# Patient Record
Sex: Female | Born: 1985 | Race: Black or African American | Hispanic: No | Marital: Married | Smoking: Never smoker
Health system: Southern US, Community
[De-identification: ages and names within clinical notes are randomized; demographics above are authoritative.]

## PROBLEM LIST (undated history)

## (undated) DIAGNOSIS — I10 Essential (primary) hypertension: Secondary | ICD-10-CM

## (undated) DIAGNOSIS — E282 Polycystic ovarian syndrome: Secondary | ICD-10-CM

## (undated) DIAGNOSIS — O139 Gestational [pregnancy-induced] hypertension without significant proteinuria, unspecified trimester: Secondary | ICD-10-CM

## (undated) DIAGNOSIS — D219 Benign neoplasm of connective and other soft tissue, unspecified: Secondary | ICD-10-CM

## (undated) DIAGNOSIS — R87629 Unspecified abnormal cytological findings in specimens from vagina: Secondary | ICD-10-CM

## (undated) DIAGNOSIS — M1990 Unspecified osteoarthritis, unspecified site: Secondary | ICD-10-CM

## (undated) DIAGNOSIS — R0600 Dyspnea, unspecified: Secondary | ICD-10-CM

## (undated) DIAGNOSIS — R011 Cardiac murmur, unspecified: Secondary | ICD-10-CM

## (undated) DIAGNOSIS — O24419 Gestational diabetes mellitus in pregnancy, unspecified control: Secondary | ICD-10-CM

## (undated) DIAGNOSIS — F419 Anxiety disorder, unspecified: Secondary | ICD-10-CM

## (undated) DIAGNOSIS — E119 Type 2 diabetes mellitus without complications: Secondary | ICD-10-CM

## (undated) DIAGNOSIS — F32A Depression, unspecified: Secondary | ICD-10-CM

## (undated) DIAGNOSIS — R9431 Abnormal electrocardiogram [ECG] [EKG]: Secondary | ICD-10-CM

## (undated) HISTORY — DX: Essential (primary) hypertension: I10

## (undated) HISTORY — PX: WISDOM TOOTH EXTRACTION: SHX21

## (undated) HISTORY — PX: OVARIAN CYST DRAINAGE: SHX325

## (undated) HISTORY — DX: Gestational diabetes mellitus in pregnancy, unspecified control: O24.419

## (undated) HISTORY — DX: Benign neoplasm of connective and other soft tissue, unspecified: D21.9

## (undated) HISTORY — DX: Dyspnea, unspecified: R06.00

## (undated) HISTORY — DX: Type 2 diabetes mellitus without complications: E11.9

## (undated) HISTORY — DX: Gestational (pregnancy-induced) hypertension without significant proteinuria, unspecified trimester: O13.9

## (undated) HISTORY — DX: Unspecified abnormal cytological findings in specimens from vagina: R87.629

## (undated) HISTORY — DX: Depression, unspecified: F32.A

## (undated) HISTORY — PX: CERVICAL BIOPSY  W/ LOOP ELECTRODE EXCISION: SUR135

## (undated) HISTORY — DX: Unspecified osteoarthritis, unspecified site: M19.90

---

## 2009-08-10 ENCOUNTER — Emergency Department (HOSPITAL_BASED_OUTPATIENT_CLINIC_OR_DEPARTMENT_OTHER): Admission: EM | Admit: 2009-08-10 | Discharge: 2009-08-10 | Payer: Self-pay | Admitting: Emergency Medicine

## 2012-10-30 ENCOUNTER — Encounter (HOSPITAL_BASED_OUTPATIENT_CLINIC_OR_DEPARTMENT_OTHER): Payer: Self-pay | Admitting: *Deleted

## 2012-10-30 ENCOUNTER — Emergency Department (HOSPITAL_BASED_OUTPATIENT_CLINIC_OR_DEPARTMENT_OTHER)
Admission: EM | Admit: 2012-10-30 | Discharge: 2012-10-30 | Disposition: A | Discharge status: Home / Self Care | Payer: 59 | Attending: Emergency Medicine | Admitting: Emergency Medicine

## 2012-10-30 ENCOUNTER — Emergency Department (HOSPITAL_BASED_OUTPATIENT_CLINIC_OR_DEPARTMENT_OTHER): Payer: 59

## 2012-10-30 DIAGNOSIS — J209 Acute bronchitis, unspecified: Secondary | ICD-10-CM | POA: Insufficient documentation

## 2012-10-30 DIAGNOSIS — R0602 Shortness of breath: Secondary | ICD-10-CM | POA: Insufficient documentation

## 2012-10-30 DIAGNOSIS — J40 Bronchitis, not specified as acute or chronic: Secondary | ICD-10-CM

## 2012-10-30 DIAGNOSIS — Z3202 Encounter for pregnancy test, result negative: Secondary | ICD-10-CM | POA: Insufficient documentation

## 2012-10-30 DIAGNOSIS — E282 Polycystic ovarian syndrome: Secondary | ICD-10-CM | POA: Insufficient documentation

## 2012-10-30 DIAGNOSIS — R509 Fever, unspecified: Secondary | ICD-10-CM | POA: Insufficient documentation

## 2012-10-30 DIAGNOSIS — Z79899 Other long term (current) drug therapy: Secondary | ICD-10-CM | POA: Insufficient documentation

## 2012-10-30 HISTORY — DX: Polycystic ovarian syndrome: E28.2

## 2012-10-30 LAB — PREGNANCY, URINE: Preg Test, Ur: NEGATIVE

## 2012-10-30 IMAGING — CR DG CHEST 2V
2 series · 2 of 2 positions shown · non-contrast
Comparison: None

CLINICAL DATA: Productive cough.

CHEST - 2 VIEW

[w chest pa]
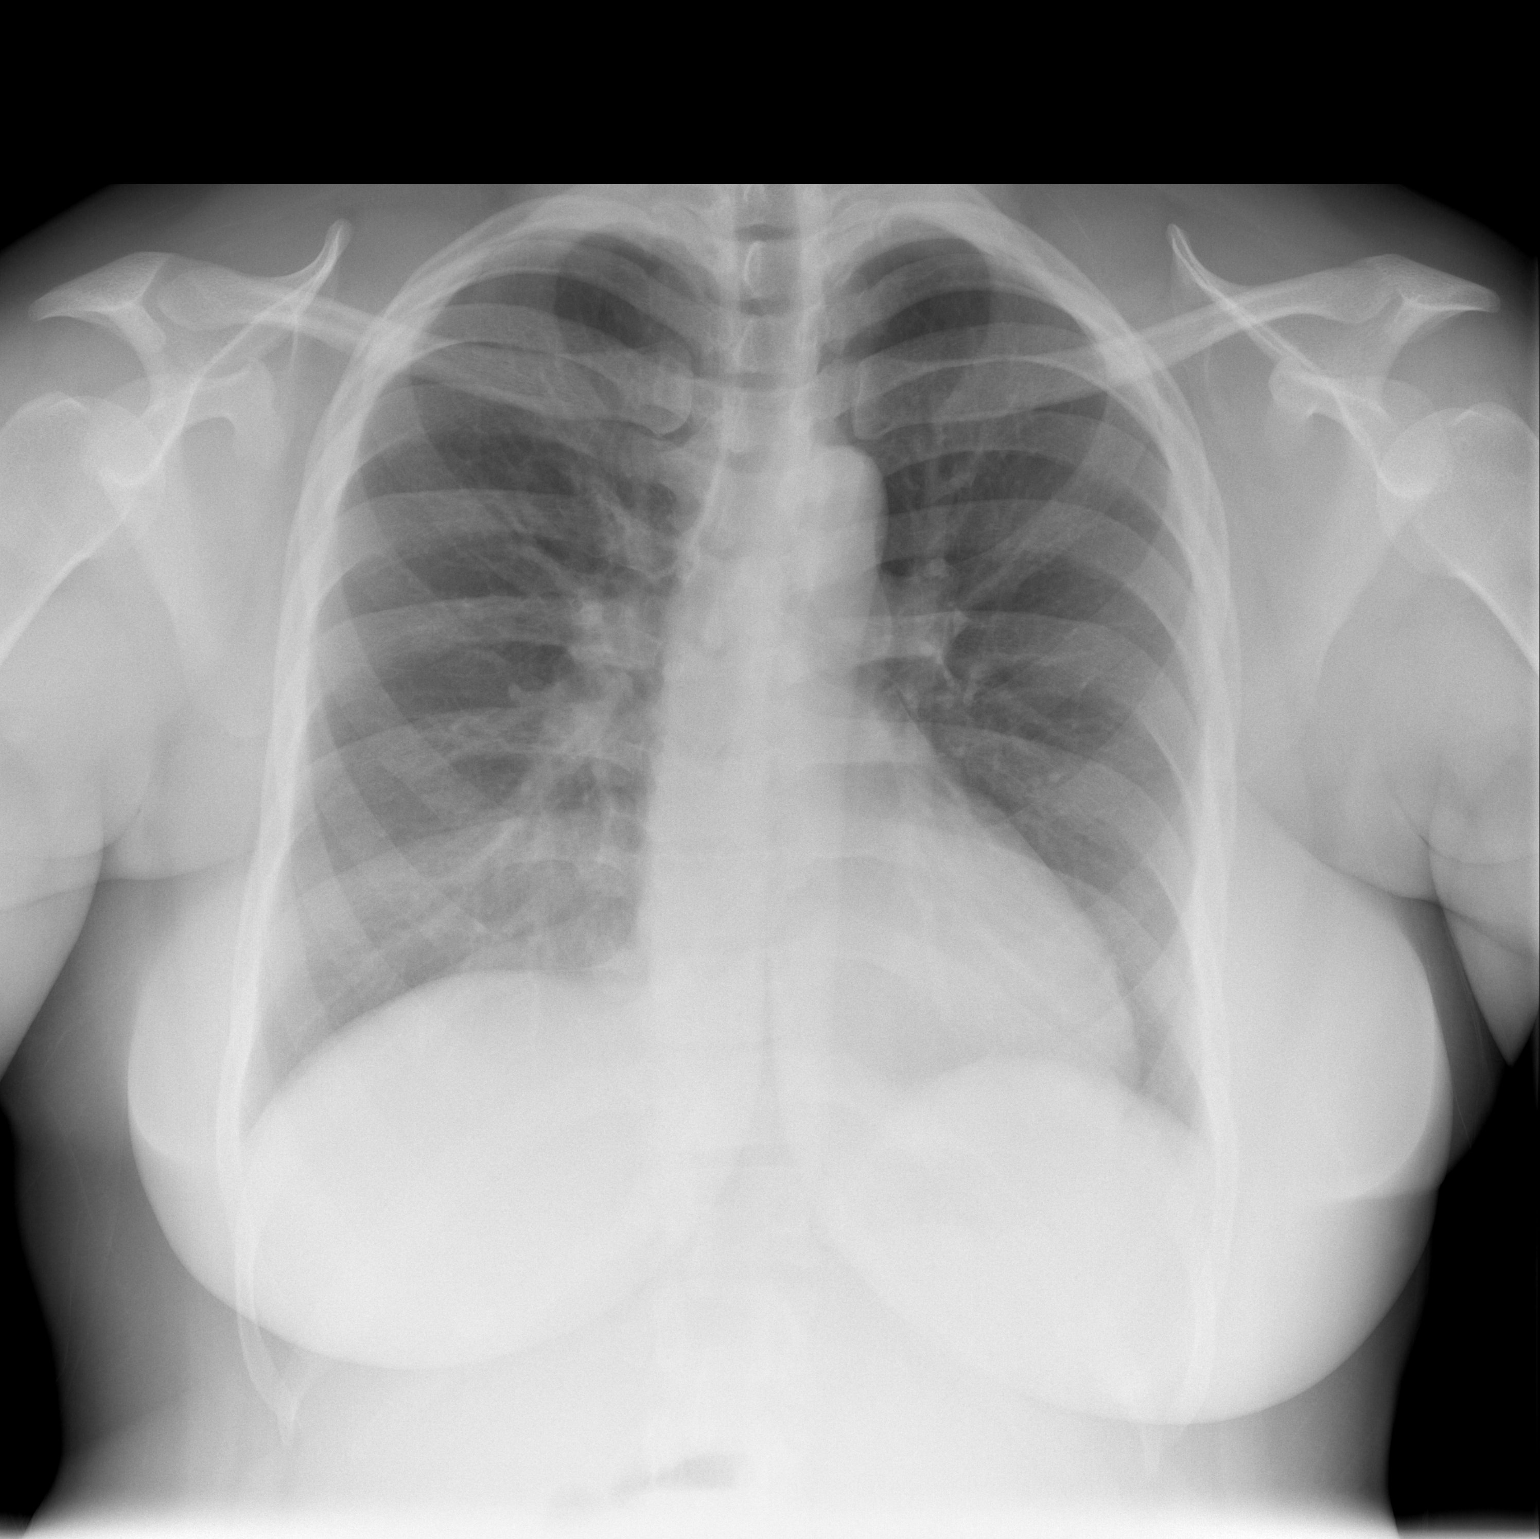

[w chest lat]
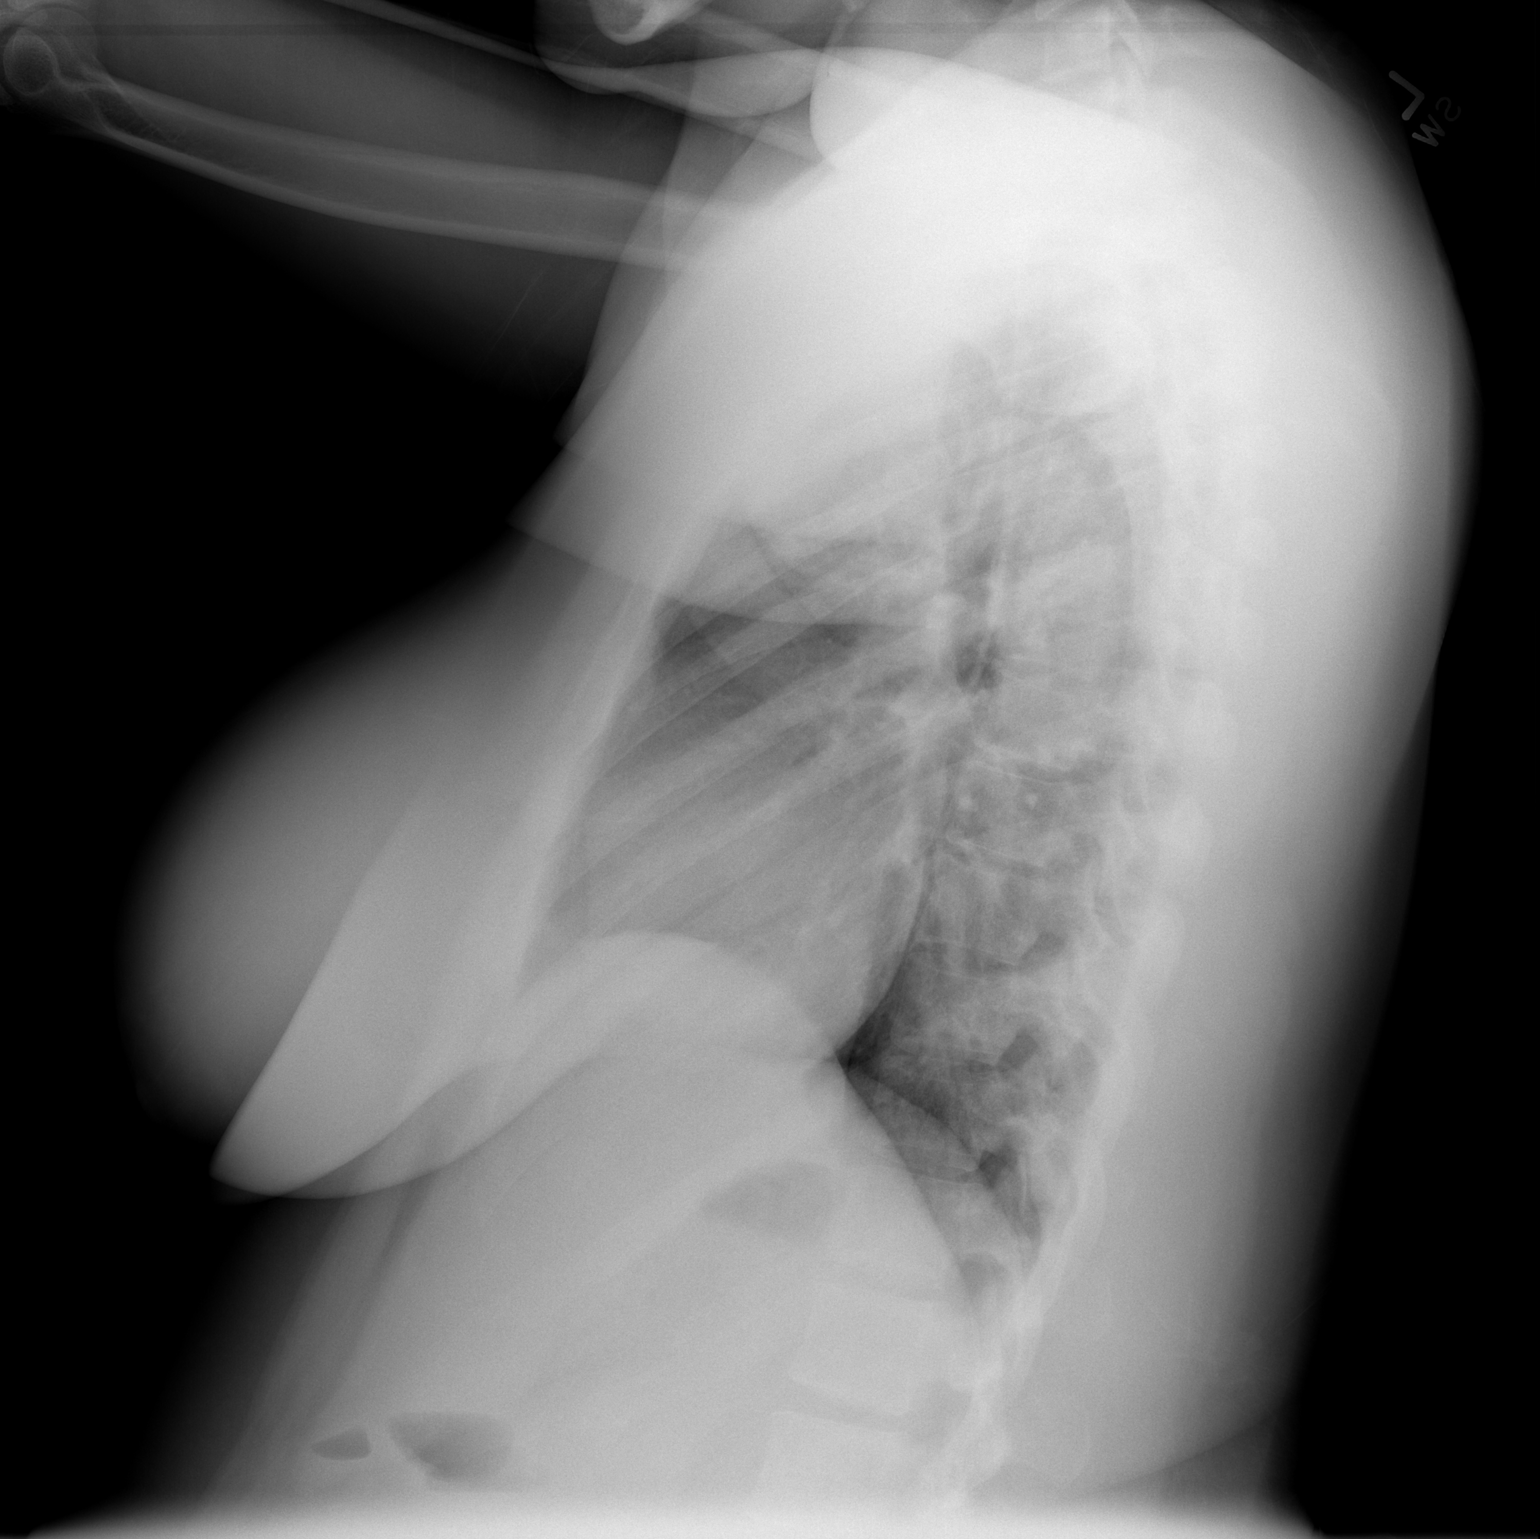

[2 of 2 positions shown; findings below may reference images not displayed]

FINDINGS: Heart and mediastinal contours are within normal limits.
No focal opacities or effusions.  No acute bony abnormality.
IMPRESSION: No active cardiopulmonary disease.

## 2012-10-30 MED ORDER — ALBUTEROL SULFATE HFA 108 (90 BASE) MCG/ACT IN AERS: 2.0000 | INHALATION_SPRAY | Freq: Four times a day (QID) | RESPIRATORY_TRACT | Status: DC | PRN

## 2012-10-30 MED ORDER — AZITHROMYCIN 250 MG PO TABS: ORAL_TABLET | ORAL | Status: DC

## 2012-10-30 NOTE — ED Notes (Signed)
Cough with thick yellowish green sputum. Started 4 days ago.

## 2012-10-30 NOTE — ED Provider Notes (Signed)
History  This chart was scribed for Emma Octave, MD by Shari Heritage, ED Scribe. The patient was seen in room MH04/MH04. Patient's care was started at 2229.   CSN: [id number]  Arrival date & time 10/30/12  2218   First MD Initiated Contact with Patient 10/30/12 2229      Chief Complaint  Patient presents with  . Cough    The history is provided by the patient. No language interpreter was used.    HPI Comments: Emma Brown is a 27 y.o. female who presents to the Emergency Department complaining of moderate, productive cough that started 4 days ago. Cough is productive of thick green sputum. She states that she has increased throat pain with cough. There is associated subjective fever and mild shortness of breath. Patient denies chest pain, abdominal pain, vomiting, nausea, diarrhea or any other symptoms. She reports multiple sick contacts at church with similar symptoms and diagnoses of bronchitis and pneumonia.  Patient does not smoke. She does not use any hormonal birth control. Patient is currently taking metformin to treat polycystic ovarian disease.    Past Medical History  Diagnosis Date  . Polycystic ovarian disease     History reviewed. No pertinent past surgical history.  No family history on file.  History  Substance Use Topics  . Smoking status: Never Smoker   . Smokeless tobacco: Not on file  . Alcohol Use: No    OB History   Grav Para Term Preterm Abortions TAB SAB Ect Mult Living                  Review of Systems A complete 10 system review of systems was obtained and all systems are negative except as noted in the HPI and PMH.   Allergies  Review of patient's allergies indicates no known allergies.  Home Medications   Current Outpatient Rx  Name  Route  Sig  Dispense  Refill  . METFORMIN HCL PO   Oral   Take by mouth.         Marland Kitchen albuterol (PROVENTIL HFA;VENTOLIN HFA) 108 (90 BASE) MCG/ACT inhaler   Inhalation   Inhale 2 puffs into  the lungs every 6 (six) hours as needed for wheezing.   1 Inhaler   2   . azithromycin (ZITHROMAX Z-PAK) 250 MG tablet      2 tablets PO day 1, then 1 tablet PO daily   6 tablet   0     Triage Vitals: BP 142/82  Pulse 92  Temp(Src) 99.8 F (37.7 C) (Oral)  Resp 16  Wt 213 lb (96.616 kg)  SpO2 98%  Physical Exam  Constitutional: She is oriented to person, place, and time. She appears well-developed and well-nourished.  HENT:  Head: Normocephalic and atraumatic.  Mouth/Throat: Mucous membranes are normal. Posterior oropharyngeal erythema present.  Mild posterior oropharyngeal erythema.  Eyes: Conjunctivae and EOM are normal. Pupils are equal, round, and reactive to light.  Neck: Normal range of motion. Neck supple.  Cardiovascular: Normal rate, regular rhythm and normal heart sounds.   No murmur heard. Pulmonary/Chest: Effort normal and breath sounds normal.  Abdominal: Soft. There is no tenderness.  Musculoskeletal: Normal range of motion.  Neurological: She is alert and oriented to person, place, and time.  Skin: Skin is warm and dry.    ED Course  Procedures (including critical care time) DIAGNOSTIC STUDIES: Oxygen Saturation is 98% on room air, normal by my interpretation.    COORDINATION OF CARE: 10:39 PM- Patient  informed of current plan for treatment and evaluation and agrees with plan at this time.   Results for orders placed during the hospital encounter of 10/30/12  PREGNANCY, URINE      Result Value Range   Preg Test, Ur NEGATIVE  NEGATIVE    Dg Chest 2 View  10/30/2012  *RADIOLOGY REPORT*  Clinical Data: Productive cough.  CHEST - 2 VIEW  Comparison: None  Findings: Heart and mediastinal contours are within normal limits. No focal opacities or effusions.  No acute bony abnormality.  IMPRESSION: No active cardiopulmonary disease.   Original Report Authenticated By: Charlett Nose, M.D.      1. Bronchitis       MDM  4 days of cough productive of  green sputum. No chest pain or shortness of breath. No fever.  Lungs clear. PE RC negative.  Will treat for bronchitis.     I personally performed the services described in this documentation, which was scribed in my presence. The recorded information has been reviewed and is accurate.    Emma Octave, MD 10/30/12 2351

## 2013-03-22 ENCOUNTER — Encounter (HOSPITAL_BASED_OUTPATIENT_CLINIC_OR_DEPARTMENT_OTHER): Payer: Self-pay | Admitting: *Deleted

## 2013-03-22 ENCOUNTER — Emergency Department (HOSPITAL_BASED_OUTPATIENT_CLINIC_OR_DEPARTMENT_OTHER)
Admission: EM | Admit: 2013-03-22 | Discharge: 2013-03-23 | Disposition: A | Discharge status: Home / Self Care | Payer: 59 | Attending: Emergency Medicine | Admitting: Emergency Medicine

## 2013-03-22 DIAGNOSIS — R059 Cough, unspecified: Secondary | ICD-10-CM | POA: Insufficient documentation

## 2013-03-22 DIAGNOSIS — R05 Cough: Secondary | ICD-10-CM | POA: Insufficient documentation

## 2013-03-22 DIAGNOSIS — Z3490 Encounter for supervision of normal pregnancy, unspecified, unspecified trimester: Secondary | ICD-10-CM

## 2013-03-22 DIAGNOSIS — K5900 Constipation, unspecified: Secondary | ICD-10-CM | POA: Insufficient documentation

## 2013-03-22 DIAGNOSIS — O9989 Other specified diseases and conditions complicating pregnancy, childbirth and the puerperium: Secondary | ICD-10-CM | POA: Insufficient documentation

## 2013-03-22 DIAGNOSIS — R509 Fever, unspecified: Secondary | ICD-10-CM | POA: Insufficient documentation

## 2013-03-22 DIAGNOSIS — Z8742 Personal history of other diseases of the female genital tract: Secondary | ICD-10-CM | POA: Insufficient documentation

## 2013-03-22 DIAGNOSIS — R300 Dysuria: Secondary | ICD-10-CM | POA: Insufficient documentation

## 2013-03-22 DIAGNOSIS — R350 Frequency of micturition: Secondary | ICD-10-CM | POA: Insufficient documentation

## 2013-03-22 DIAGNOSIS — R109 Unspecified abdominal pain: Secondary | ICD-10-CM | POA: Insufficient documentation

## 2013-03-22 DIAGNOSIS — Z79899 Other long term (current) drug therapy: Secondary | ICD-10-CM | POA: Insufficient documentation

## 2013-03-22 DIAGNOSIS — E860 Dehydration: Secondary | ICD-10-CM | POA: Insufficient documentation

## 2013-03-22 LAB — URINALYSIS, ROUTINE W REFLEX MICROSCOPIC
Nitrite: NEGATIVE
Specific Gravity, Urine: 1.03 (ref 1.005–1.030)
Urobilinogen, UA: 0.2 mg/dL (ref 0.0–1.0)
pH: 5.5 (ref 5.0–8.0)

## 2013-03-22 LAB — PREGNANCY, URINE: Preg Test, Ur: POSITIVE — AB

## 2013-03-22 NOTE — ED Notes (Signed)
Pt c/o painful urination and freq x 1 week

## 2013-03-23 NOTE — ED Notes (Signed)
MD at bedside. 

## 2013-03-23 NOTE — ED Provider Notes (Signed)
CSN: [id number]     Arrival date & time 03/22/13  2331 History  This chart was scribed for Loren Racer, MD by Dorothey Baseman, ED Scribe. This patient was seen in room MH10/MH10 and the patient's care was started at 12:31 AM.    Chief Complaint  Patient presents with  . Dysuria   Patient is a 27 y.o. female presenting with dysuria. The history is provided by the patient. No language interpreter was used.  Dysuria Pain quality:  Sharp Pain severity:  Moderate Onset quality:  Sudden Duration: 1 week. Timing:  Intermittent (after urination) Progression:  Unchanged Chronicity:  New Urinary symptoms: frequent urination   Associated symptoms: abdominal pain and fever ( subjective)   Risk factors: pregnant now    HPI Comments: Emma Brown is a 27 y.o. female who presents to the Emergency Department complaining of dysuria with a sharp pain to the suprapubic region and associated urinary frequency and irregular BMs onset 1 week ago. She reports cough with yellowish sputum and a mild subjective fever. She denies vaginal bleeding, chills. Patient is currently [redacted] weeks pregnant.   Past Medical History  Diagnosis Date  . Polycystic ovarian disease    History reviewed. No pertinent past surgical history. History reviewed. No pertinent family history. History  Substance Use Topics  . Smoking status: Never Smoker   . Smokeless tobacco: Not on file  . Alcohol Use: No   OB History   Grav Para Term Preterm Abortions TAB SAB Ect Mult Living                 Review of Systems  Constitutional: Positive for fever ( subjective). Negative for chills.  Respiratory: Positive for cough.   Gastrointestinal: Positive for abdominal pain.  Genitourinary: Positive for dysuria and frequency. Negative for vaginal bleeding.  All other systems reviewed and are negative.    Allergies  Review of patient's allergies indicates no known allergies.  Home Medications   Current Outpatient Rx  Name   Route  Sig  Dispense  Refill  . progesterone (PROMETRIUM) 200 MG capsule   Oral   Take 200 mg by mouth daily.         Marland Kitchen albuterol (PROVENTIL HFA;VENTOLIN HFA) 108 (90 BASE) MCG/ACT inhaler   Inhalation   Inhale 2 puffs into the lungs every 6 (six) hours as needed for wheezing.   1 Inhaler   2   . METFORMIN HCL PO   Oral   Take by mouth.          Triage Vitals: BP 134/68  Pulse 92  Temp(Src) 99 F (37.2 C) (Oral)  Resp 16  Ht 5\' 6"  (1.676 m)  Wt 213 lb (96.616 kg)  BMI 34.4 kg/m2  SpO2 100%  LMP 02/14/2013  Physical Exam  Nursing note and vitals reviewed. Constitutional: She is oriented to person, place, and time. She appears well-developed and well-nourished. No distress.  HENT:  Head: Normocephalic and atraumatic.  Mouth/Throat: Oropharynx is clear and moist.  Eyes: Conjunctivae and EOM are normal. Pupils are equal, round, and reactive to light.  Neck: Normal range of motion. Neck supple.  Cardiovascular: Normal rate, regular rhythm and normal heart sounds.   Pulmonary/Chest: Effort normal and breath sounds normal. No respiratory distress. She has no wheezes. She has no rales.  Abdominal: Soft. Bowel sounds are normal. She exhibits no distension and no mass. There is no tenderness. There is no rebound and no guarding.  Musculoskeletal: Normal range of motion. She exhibits no  edema and no tenderness.  No CVA tenderness.  Neurological: She is alert and oriented to person, place, and time.  Skin: Skin is warm and dry. No rash noted. No erythema.  Psychiatric: She has a normal mood and affect. Her behavior is normal.    ED Course  Procedures (including critical care time)  DIAGNOSTIC STUDIES: Oxygen Saturation is 100% on room air, normal by my interpretation.    COORDINATION OF CARE: 12:43AM- Discussed that symptoms are likely viral and that antibiotics will not be necessary. Advised patient to drink plenty of water and to take Tylenol at home PRN. Discussed  treatment plan with patient at bedside and patient verbalized agreement. Discussed treatment plan with patient at bedside and patient verbalized agreement.      Labs Review Labs Reviewed  URINALYSIS, ROUTINE W REFLEX MICROSCOPIC - Abnormal; Notable for the following:    Ketones, ur 15 (*)    All other components within normal limits  PREGNANCY, URINE - Abnormal; Notable for the following:    Preg Test, Ur POSITIVE (*)    All other components within normal limits   Imaging Review No results found.  MDM  I personally performed the services described in this documentation, which was scribed in my presence. The recorded information has been reviewed and is accurate.  The patient's constipation she is advised to prune juice. She has a URI which likely viral. She's also been encouraged to drink plenty of water. She is advised to followup with her OB/GYN. Return precautions have been given.  Loren Racer, MD 03/23/13 [phone]

## 2013-06-15 ENCOUNTER — Emergency Department (HOSPITAL_BASED_OUTPATIENT_CLINIC_OR_DEPARTMENT_OTHER)
Admission: EM | Admit: 2013-06-15 | Discharge: 2013-06-15 | Disposition: A | Discharge status: Home / Self Care | Payer: 59 | Attending: Emergency Medicine | Admitting: Emergency Medicine

## 2013-06-15 ENCOUNTER — Encounter (HOSPITAL_BASED_OUTPATIENT_CLINIC_OR_DEPARTMENT_OTHER): Payer: Self-pay | Admitting: Emergency Medicine

## 2013-06-15 DIAGNOSIS — Z8639 Personal history of other endocrine, nutritional and metabolic disease: Secondary | ICD-10-CM | POA: Insufficient documentation

## 2013-06-15 DIAGNOSIS — Z862 Personal history of diseases of the blood and blood-forming organs and certain disorders involving the immune mechanism: Secondary | ICD-10-CM | POA: Diagnosis not present

## 2013-06-15 DIAGNOSIS — S335XXA Sprain of ligaments of lumbar spine, initial encounter: Secondary | ICD-10-CM | POA: Diagnosis not present

## 2013-06-15 DIAGNOSIS — S76219A Strain of adductor muscle, fascia and tendon of unspecified thigh, initial encounter: Secondary | ICD-10-CM

## 2013-06-15 DIAGNOSIS — Z794 Long term (current) use of insulin: Secondary | ICD-10-CM | POA: Diagnosis not present

## 2013-06-15 DIAGNOSIS — S39012A Strain of muscle, fascia and tendon of lower back, initial encounter: Secondary | ICD-10-CM

## 2013-06-15 DIAGNOSIS — Z79899 Other long term (current) drug therapy: Secondary | ICD-10-CM | POA: Diagnosis not present

## 2013-06-15 DIAGNOSIS — IMO0002 Reserved for concepts with insufficient information to code with codable children: Secondary | ICD-10-CM | POA: Diagnosis not present

## 2013-06-15 DIAGNOSIS — Y9389 Activity, other specified: Secondary | ICD-10-CM | POA: Insufficient documentation

## 2013-06-15 DIAGNOSIS — O9989 Other specified diseases and conditions complicating pregnancy, childbirth and the puerperium: Secondary | ICD-10-CM | POA: Insufficient documentation

## 2013-06-15 DIAGNOSIS — Y929 Unspecified place or not applicable: Secondary | ICD-10-CM | POA: Insufficient documentation

## 2013-06-15 DIAGNOSIS — X503XXA Overexertion from repetitive movements, initial encounter: Secondary | ICD-10-CM | POA: Diagnosis not present

## 2013-06-15 LAB — URINALYSIS, ROUTINE W REFLEX MICROSCOPIC
Ketones, ur: >80 mg/dL — AB
Leukocytes, UA: NEGATIVE
Nitrite: NEGATIVE
Protein, ur: NEGATIVE mg/dL
Urobilinogen, UA: 0.2 mg/dL (ref 0.0–1.0)

## 2013-06-15 NOTE — ED Provider Notes (Signed)
CSN: [id number]     Arrival date & time 06/15/13  0014 History   First MD Initiated Contact with Patient 06/15/13 0047     Chief Complaint  Patient presents with  . Abdominal Pain   (Consider location/radiation/quality/duration/timing/severity/associated sxs/prior Treatment) HPI Patient is [redacted] weeks pregnant by a 9 week ultrasound to confirm intrauterine pregnancy. She's had no vaginal complaints. No bleeding or discharge. No fevers or chills. No dysuria. She presents after moving furniture one week ago and has had persistent low back and bilateral groin pain. The pain is worse with movement or palpation. She has not been taking anything at home for the pain. She has no focal weakness or numbness. She's not having any abdominal tenderness, nausea, vomiting or constipation. Past Medical History  Diagnosis Date  . Polycystic ovarian disease    Past Surgical History  Procedure Laterality Date  . Ovarian cyst drainage     History reviewed. No pertinent family history. History  Substance Use Topics  . Smoking status: Never Smoker   . Smokeless tobacco: Not on file  . Alcohol Use: No   OB History   Grav Para Term Preterm Abortions TAB SAB Ect Mult Living   1              Review of Systems  Constitutional: Negative for fever and chills.  Respiratory: Negative for shortness of breath.   Cardiovascular: Negative for chest pain.  Gastrointestinal: Negative for nausea, vomiting, abdominal pain and diarrhea.  Genitourinary: Negative for dysuria, frequency, flank pain, vaginal bleeding, vaginal discharge and vaginal pain.  Musculoskeletal: Positive for back pain and myalgias. Negative for arthralgias, neck pain and neck stiffness.  Skin: Negative for pallor, rash and wound.  Neurological: Negative for dizziness, weakness, light-headedness, numbness and headaches.  All other systems reviewed and are negative.    Allergies  Review of patient's allergies indicates no known  allergies.  Home Medications   Current Outpatient Rx  Name  Route  Sig  Dispense  Refill  . insulin aspart (NOVOLOG) 100 UNIT/ML injection   Subcutaneous   Inject 8 Units into the skin at bedtime.         Marland Kitchen albuterol (PROVENTIL HFA;VENTOLIN HFA) 108 (90 BASE) MCG/ACT inhaler   Inhalation   Inhale 2 puffs into the lungs every 6 (six) hours as needed for wheezing.   1 Inhaler   2   . METFORMIN HCL PO   Oral   Take by mouth.         . progesterone (PROMETRIUM) 200 MG capsule   Oral   Take 200 mg by mouth daily.          BP 129/80  Pulse 88  Temp(Src) 98.6 F (37 C) (Oral)  Resp 18  Ht 5\' 7"  (1.702 m)  Wt 216 lb (97.977 kg)  BMI 33.82 kg/m2  SpO2 100%  LMP 02/14/2013 Physical Exam  Nursing note and vitals reviewed. Constitutional: She is oriented to person, place, and time. She appears well-developed and well-nourished. No distress.  HENT:  Head: Normocephalic and atraumatic.  Mouth/Throat: Oropharynx is clear and moist.  Eyes: EOM are normal. Pupils are equal, round, and reactive to light.  Neck: Normal range of motion. Neck supple.  Cardiovascular: Normal rate and regular rhythm.   Pulmonary/Chest: Effort normal and breath sounds normal. No respiratory distress. She has no wheezes. She has no rales.  Abdominal: Soft. Bowel sounds are normal. She exhibits no distension and no mass. There is no tenderness. There is no  rebound and no guarding.  Gravid abdomen with uterine fundus just inferior to umbilicus. No tenderness over the fundus.  Musculoskeletal: Normal range of motion. She exhibits tenderness. She exhibits no edema.  Dose palpation in the bilateral lumbar paraspinal muscles as well as the bilateral inguinal areas. No masses noted in the inguinal areas.  Neurological: She is alert and oriented to person, place, and time.  Skin: Skin is warm and dry. No rash noted. No erythema.  Psychiatric: She has a normal mood and affect. Her behavior is normal.    ED  Course  Procedures (including critical care time) Labs Review Labs Reviewed  URINALYSIS, ROUTINE W REFLEX MICROSCOPIC - Abnormal; Notable for the following:    Ketones, ur >80 (*)    All other components within normal limits   Imaging Review No results found.  EKG Interpretation   None       MDM   1. Groin strain, initial encounter   2. Low back strain, initial encounter    Bedside ultrasound with intrauterine pregnancy, fetal movement and fetal heart tones in the 140s. Mother reassured. Advised to take Tylenol as needed for pain. Advised to use heat as needed. She has a followup appointment with her OB/GYN shortly. Return precautions have been given including vaginal bleeding, fever, weakness, numbness, inability control bladder or for any concerns.    Loren Racer, MD 06/15/13 [phone]

## 2013-06-15 NOTE — ED Notes (Signed)
Fetal heart tones auscultated at 148

## 2013-06-15 NOTE — ED Notes (Signed)
Pt sts she is [redacted]weeks pregnant and started having lower abdominal pain 1 week ago, described as sharp and achy, radiates to back; Also sharp stabbing pain in RLQ. Pt confirms prenatal care. No complications with pregnancy except gestational diabetes. Denies N/V/D, fever or chills.

## 2014-04-22 ENCOUNTER — Encounter (HOSPITAL_BASED_OUTPATIENT_CLINIC_OR_DEPARTMENT_OTHER): Payer: Self-pay | Admitting: Emergency Medicine

## 2020-10-23 ENCOUNTER — Other Ambulatory Visit: Payer: Self-pay

## 2020-10-23 ENCOUNTER — Encounter: Payer: Self-pay | Admitting: Obstetrics

## 2020-10-23 ENCOUNTER — Other Ambulatory Visit (HOSPITAL_COMMUNITY)
Admission: RE | Admit: 2020-10-23 | Discharge: 2020-10-23 | Disposition: A | Discharge status: Home / Self Care | Payer: BC Managed Care – PPO | Source: Ambulatory Visit | Attending: Obstetrics | Admitting: Obstetrics

## 2020-10-23 ENCOUNTER — Ambulatory Visit (INDEPENDENT_AMBULATORY_CARE_PROVIDER_SITE_OTHER): Payer: BC Managed Care – PPO | Admitting: Obstetrics

## 2020-10-23 VITALS — BP 146/95 | HR 94 | Ht 67.0 in | Wt 220.0 lb

## 2020-10-23 DIAGNOSIS — Z01419 Encounter for gynecological examination (general) (routine) without abnormal findings: Secondary | ICD-10-CM | POA: Insufficient documentation

## 2020-10-23 DIAGNOSIS — Z113 Encounter for screening for infections with a predominantly sexual mode of transmission: Secondary | ICD-10-CM | POA: Diagnosis not present

## 2020-10-23 DIAGNOSIS — E282 Polycystic ovarian syndrome: Secondary | ICD-10-CM

## 2020-10-23 DIAGNOSIS — N926 Irregular menstruation, unspecified: Secondary | ICD-10-CM

## 2020-10-23 DIAGNOSIS — E669 Obesity, unspecified: Secondary | ICD-10-CM

## 2020-10-23 DIAGNOSIS — N898 Other specified noninflammatory disorders of vagina: Secondary | ICD-10-CM | POA: Insufficient documentation

## 2020-10-23 DIAGNOSIS — Z3169 Encounter for other general counseling and advice on procreation: Secondary | ICD-10-CM

## 2020-10-23 MED ORDER — LO LOESTRIN FE 1 MG-10 MCG / 10 MCG PO TABS
1.0000 | ORAL_TABLET | Freq: Every day | ORAL | 4 refills | Status: DC
Start: 2020-10-23 — End: 2021-10-13

## 2020-10-23 MED ORDER — METFORMIN HCL ER 500 MG PO TB24: 1500.0000 mg | ORAL_TABLET | Freq: Every day | ORAL | 11 refills | Status: DC

## 2020-10-23 MED ORDER — CONCEPT OB 130-92.4-1 MG PO CAPS
1.0000 | ORAL_CAPSULE | Freq: Every day | ORAL | 3 refills | Status: DC
Start: 2020-10-23 — End: 2022-01-08

## 2020-10-23 NOTE — Patient Instructions (Signed)
Preparing for Pregnancy If you are planning to become pregnant, talk to your health care provider about preconception care. This type of care helps you prepare for a safe and healthy pregnancy. During this visit, your health care provider will:  Do a complete physical exam, including a Pap test.  Take your complete medical history.  Give you information, answer your questions, and help you resolve problems. Preconception checklist Medical history  Tell your health care provider about any medical conditions you have or have had. Your pregnancy or your ability to become pregnant may be affected by long-term (chronic) conditions, such as: ? Diabetes. ? High blood pressure (hypertension). ? Thyroid problems.  Tell your health care provider about your family's medical history and your partner's medical history.  Tell your health care provider if you have or have had any sexually transmitted infections, orSTIs. These can affect your pregnancy. In some cases, they can be passed to your baby.  If needed, discuss the benefits of genetic testing. This test checks for conditions that may be passed from parent to child.  Tell your health care provider about: ? Any problems you had getting pregnant or while pregnant. ? Any medicines you take. These include vitamins, herbal supplements, and over-the-counter medicines. ? Your history of getting vaccines. Discuss any vaccines that you may need. Diet  Ask your health care provider about what foods to eat in order to get a balance of nutrients. This is especially important when you are pregnant or preparing to become pregnant. It is recommended that women of childbearing age take a folic acid supplement of 400 mcg daily and eat foods rich in folic acid to prevent certain birth defects.  Ask your health care provider to help you reach a healthy weight before pregnancy. ? If you are overweight, you may have a higher risk for certain problems. These  include hypertension, diabetes, and early (preterm) birth. ? If you are underweight, you are more likely to have a baby who has a low birth weight. Lifestyle, work, and home Let your health care provider know about:  Any lifestyle habits that you have, such as use of alcohol, drugs, or tobacco products.  Fun and leisure activities that may put you at risk during pregnancy, such as downhill skiing and certain exercise programs.  Any plans to travel out of the country, especially to places with an active [place] virus outbreak.  Harmful substances that you may be exposed to at work or at home. These include chemicals, pesticides, radiation, and substances from cat litter boxes.  Any concerns you have for your safety at home. Mental health Tell your health care provider about:  Any history of mental health conditions, including feelings of depression, sadness, or anxiety.  Any medicines that you take for a mental health condition. These include herbs and supplements. How do I know that I am pregnant? You may be pregnant if you have been sexually active and you miss your period. Other symptoms of early pregnancy include:  Mild cramping.  Very light vaginal bleeding (spotting).  Feeling more tired than usual.  Nausea and vomiting. These may be signs of morning sickness. Take a home pregnancy test if you have any of these symptoms. This test checks for a hormone in your urine called human chorionic gonadotropin, or hCG. A woman's body begins to make this hormone during early pregnancy. These tests are very accurate. Wait until at least the first day after you miss your period to take a home pregnancy  home pregnancy test. If the test shows that you are pregnant, call your health care provider for a prenatal care visit. What should I do if I become pregnant?  Schedule a visit with your health care provider as soon as you suspect you are pregnant.  Talk to your health care provider if you are taking  prescription medicines to determine if they are safe to take during pregnancy.  You may continue to have sex if it does not cause pain or other problems, such as vaginal bleeding. Follow these instructions at home: Eating and drinking  Follow instructions from your health care provider about eating or drinking restrictions.  Drink enough fluid to keep your urine pale yellow.  Eat a balanced diet. This includes fresh fruits and vegetables, whole grains, lean meats, low-fat dairy products, healthy fats, and foods that are high in fiber. Ask to meet with a nutritionist or registered dietitian for help with meal planning and goals.  Avoid eating raw or undercooked meat and seafood.  Avoid eating or drinking unpasteurized dairy products.   Lifestyle  Get regular exercise. Try to be active for at least 30 minutes a day on most days of the week. Ask your health care provider which activities are safe during pregnancy.  Maintain a healthy weight.  Avoid toxic fumes and chemicals.  Avoid cleaning cat litter boxes. Cat feces may contain a harmful parasite called toxoplasma.  Avoid travel to countries where Zika virus is common.  Do not use any products that contain nicotine or tobacco, such as cigarettes, e-cigarettes, and chewing tobacco. If you need help quitting, ask your health care provider.  Do not drink alcohol or use drugs.      General instructions  Keep an accurate record of your menstrual periods. This makes it easier for your health care provider to determine your baby's due date.  Take over-the-counter and prescription medicines only as told by your health care provider.  Begin taking prenatal vitamins and folic acid supplements daily as directed.  Manage any chronic conditions, such as hypertension and diabetes, as told by your health care provider. This is important. Summary  If you are planning to become pregnant, talk to your health care provider about preconception  care. This is an important part of planning for a healthy pregnancy.  Women of childbearing age should take 400 mcg of folic acid daily in addition to eating a diet rich in folic acid. This will prevent certain birth defects.  Schedule a visit with your health care provider as soon as you suspect you are pregnant. Tell your health care provider about your medical history, lifestyle activities, home safety, and other things that may concern you. This information is not intended to replace advice given to you by your health care provider. Make sure you discuss any questions you have with your health care provider. Document Revised: 03/07/2019 Document Reviewed: 03/07/2019 [employer] Patient Education  2021 [employer]  

## 2020-10-23 NOTE — Progress Notes (Signed)
Subjective:        Emma Brown is a 35 y.o. female here for a routine exam.  Current complaints: Vaginal discharge with itching and burning.  Also has spotting after intercourse..    Personal health questionnaire:  Is patient Ashkenazi Jewish, have a family history of breast and/or ovarian cancer: no Is there a family history of uterine cancer diagnosed at age < 57, gastrointestinal cancer, urinary tract cancer, family member who is a Field seismologist syndrome-associated carrier: no Is the patient overweight and hypertensive, family history of diabetes, personal history of gestational diabetes, preeclampsia or PCOS: yes Is patient over 72, have PCOS,  family history of premature CHD under age 63, diabetes, smoke, have hypertension or peripheral artery disease:  no At any time, has a partner hit, kicked or otherwise hurt or frightened you?: no Over the past 2 weeks, have you felt down, depressed or hopeless?: no Over the past 2 weeks, have you felt little interest or pleasure in doing things?:no   Gynecologic History Patient's last menstrual period was 05/25/2020. Contraception: none Last Pap: 2020. Results were: normal Last mammogram: n/a. Results were: n/a  Obstetric History OB History  Gravida Para Term Preterm AB Living  3 3   3   3   SAB IAB Ectopic Multiple Live Births               # Outcome Date GA Lbr Len/2nd Weight Sex Delivery Anes PTL Lv  3 Preterm           2 Preterm           1 Preterm             Past Medical History:  Diagnosis Date  . Arthritis   . Depression   . Diabetes mellitus without complication ([place])   . Dyspnea   . Fibroid   . Gestational diabetes   . Hypertension   . Polycystic ovarian disease   . Pregnancy induced hypertension   . Preterm labor   . Vaginal Pap smear, abnormal     Past Surgical History:  Procedure Laterality Date  . CESAREAN SECTION    . OVARIAN CYST DRAINAGE    . OVARIAN CYST DRAINAGE    . WISDOM TOOTH EXTRACTION        Current Outpatient Medications:  .  amLODipine (NORVASC) 5 MG tablet, amlodipine 5 mg tablet  TAKE 1 TABLET BY MOUTH ONCE DAILY, Disp: , Rfl:  .  hydrochlorothiazide (HYDRODIURIL) 12.5 MG tablet, hydrochlorothiazide 12.5 mg tablet  TAKE 1 TABLET BY MOUTH ONCE DAILY, Disp: , Rfl:  .  albuterol (PROVENTIL HFA;VENTOLIN HFA) 108 (90 BASE) MCG/ACT inhaler, Inhale 2 puffs into the lungs every 6 (six) hours as needed for wheezing. (Patient not taking: Reported on 10/23/2020), Disp: 1 Inhaler, Rfl: 2 .  buPROPion (WELLBUTRIN XL) 150 MG 24 hr tablet, Take 1 tablet by mouth daily., Disp: , Rfl:  .  insulin aspart (NOVOLOG) 100 UNIT/ML injection, Inject 8 Units into the skin at bedtime. (Patient not taking: Reported on 10/23/2020), Disp: , Rfl:  .  METFORMIN HCL PO, Take by mouth. (Patient not taking: Reported on 10/23/2020), Disp: , Rfl:  .  OZEMPIC, 0.25 OR 0.5 MG/DOSE, 2 MG/1.5ML SOPN, Inject 0.5 mg into the skin once a week., Disp: , Rfl:  .  progesterone (PROMETRIUM) 200 MG capsule, Take 200 mg by mouth daily. (Patient not taking: Reported on 10/23/2020), Disp: , Rfl:  .  valsartan (DIOVAN) 40 MG tablet, valsartan 40 mg tablet,  Disp: , Rfl:  Allergies  Allergen Reactions  . Pineapple Swelling  . Shellfish Allergy Shortness Of Breath    Social History   Tobacco Use  . Smoking status: Never Smoker  . Smokeless tobacco: Never Used  Substance Use Topics  . Alcohol use: No    Family History  Problem Relation Age of Onset  . Cancer Mother   . Hypertension Mother   . Hypertension Brother   . Hypertension Maternal Aunt   . Hypertension Maternal Uncle   . Hypertension Maternal Grandmother   . Cancer Maternal Grandfather       Review of Systems  Constitutional: negative for fatigue and weight loss Respiratory: negative for cough and wheezing Cardiovascular: negative for chest pain, fatigue and palpitations Gastrointestinal: negative for abdominal pain and change in bowel  habits Musculoskeletal:negative for myalgias Neurological: negative for gait problems and tremors Behavioral/Psych: negative for abusive relationship, depression Endocrine: negative for temperature intolerance    Genitourinary: positive for vaginal discharge, itching, burning, and spotting after intercourse.  negative for abnormal menstrual periods, genital lesions, hot flashes, sexual problems  Integument/breast: negative for breast lump, breast tenderness, nipple discharge and skin lesion(s)    Objective:       BP (!) 146/95   Pulse 94   Ht 5\' 7"  (1.702 m)   Wt 220 lb (99.8 kg)   LMP 05/25/2020   BMI 34.46 kg/m  General:   alert and no distress  Skin:   no rash or abnormalities  Lungs:   clear to auscultation bilaterally  Heart:   regular rate and rhythm, S1, S2 normal, no murmur, click, rub or gallop  Breasts:   normal without suspicious masses, skin or nipple changes or axillary nodes  Abdomen:  normal findings: no organomegaly, soft, non-tender and no hernia  Pelvis:  External genitalia: normal general appearance Urinary system: urethral meatus normal and bladder without fullness, nontender Vaginal: normal without tenderness, induration or masses Cervix: normal appearance Adnexa: normal bimanual exam Uterus: anteverted and non-tender, normal size   Lab Review Urine pregnancy test Labs reviewed yes Radiologic studies reviewed no  I have spent a total of 20 minutes of face-to-face time, excluding clinical staff time, reviewing notes and preparing to see patient, ordering tests and/or medications, and counseling the patient.  Assessment:     1. Encounter for routine gynecological examination with Papanicolaou smear of cervix Rx: - Cytology - PAP( [place])  2. Vaginal discharge Rx: - Cervicovaginal ancillary only( [place])  3. Screen for STD (sexually transmitted disease) Rx: - RPR+HBsAg+HCVAb+...  4. Irregular periods Rx: - US PELVIC COMPLETE WITH  TRANSVAGINAL; Future  5. PCOS (polycystic ovarian syndrome) Rx: - LO LOESTRIN FE 1 MG-10 MCG / 10 MCG tablet; Take 1 tablet by mouth daily.  Dispense: 84 tablet; Refill: 4 - metFORMIN (GLUCOPHAGE XR) 500 MG 24 hr tablet; Take 3 tablets (1,500 mg total) by mouth daily after supper.  Dispense: 90 tablet; Refill: 11  6. Obesity (BMI 30.0-34.9) - program of caloric reduction, exercise and behavioral modification recommended  7. Encounter for preconception consultation Rx: - Prenat w/o A Vit-FeFum-FePo-FA (CONCEPT OB) 130-92.4-1 MG CAPS; Take 1 capsule by mouth daily before breakfast.  Dispense: 90 capsule; Refill: 3    Plan:    Education reviewed: calcium supplements, depression evaluation, low fat, low cholesterol diet, safe sex/STD prevention, self breast exams, skin cancer screening and weight bearing exercise. Contraception: OCP (estrogen/progesterone). Follow up in: 4 months.   No orders of the defined types were  placed in this encounter.  Orders Placed This Encounter  Procedures  . RPR+HBsAg+HCVAb+...    Shelly Bombard, MD 10/23/2020 8:51 AM

## 2020-10-23 NOTE — Progress Notes (Signed)
New GYN presents for AEX/PAP/STD screening.  C/o spotting after sex, brownish discharge, itching, cramps 2/10 x 2 weeks, no periods since 05/2020.

## 2020-10-24 LAB — CYTOLOGY - PAP
Comment: NEGATIVE
Diagnosis: NEGATIVE
High risk HPV: NEGATIVE

## 2020-10-24 LAB — CERVICOVAGINAL ANCILLARY ONLY
Bacterial Vaginitis (gardnerella): NEGATIVE
Candida Glabrata: NEGATIVE
Candida Vaginitis: NEGATIVE
Chlamydia: NEGATIVE
Comment: NEGATIVE
Comment: NEGATIVE
Comment: NEGATIVE
Comment: NEGATIVE
Comment: NEGATIVE
Comment: NORMAL
Neisseria Gonorrhea: NEGATIVE
Trichomonas: NEGATIVE

## 2020-10-24 LAB — RPR+HBSAG+HCVAB+...
HIV Screen 4th Generation wRfx: NONREACTIVE
Hep C Virus Ab: 0.1 s/co ratio (ref 0.0–0.9)
Hepatitis B Surface Ag: NEGATIVE
RPR Ser Ql: NONREACTIVE

## 2020-11-03 ENCOUNTER — Other Ambulatory Visit: Payer: Self-pay

## 2020-11-03 ENCOUNTER — Encounter: Payer: Self-pay | Admitting: Internal Medicine

## 2020-11-03 ENCOUNTER — Ambulatory Visit (INDEPENDENT_AMBULATORY_CARE_PROVIDER_SITE_OTHER): Payer: BC Managed Care – PPO | Admitting: Internal Medicine

## 2020-11-03 VITALS — BP 140/86 | HR 95 | Ht 67.0 in | Wt 217.5 lb

## 2020-11-03 DIAGNOSIS — E559 Vitamin D deficiency, unspecified: Secondary | ICD-10-CM | POA: Diagnosis not present

## 2020-11-03 DIAGNOSIS — E1165 Type 2 diabetes mellitus with hyperglycemia: Secondary | ICD-10-CM

## 2020-11-03 LAB — POCT GLUCOSE (DEVICE FOR HOME USE): POC Glucose: 217 mg/dl — AB (ref 70–99)

## 2020-11-03 LAB — POCT GLYCOSYLATED HEMOGLOBIN (HGB A1C): Hemoglobin A1C: 8 % — AB (ref 4.0–5.6)

## 2020-11-03 NOTE — Progress Notes (Signed)
Name: [name]  MRN/ DOB: [id number], Jan 01, 1986   Age/ Sex: 35 y.o., female    PCP: Jordan Hawks, NP   Reason for Endocrinology Evaluation: Type 2 Diabetes Mellitus/ Hypercalcemia      Date of Initial Endocrinology Visit: 11/03/2020     PATIENT IDENTIFIER: Emma Brown is a 35 y.o. female with a past medical history of T2DM, and HTN. The patient presented for initial endocrinology clinic visit on 11/03/2020 for consultative assistance with her diabetes management.    HPI: Ms. Remmert was    Diagnosed with DM 2017 Prior Medications tried/Intolerance:  Was on insulin during pregnancy 2020  Currently checking blood sugars 0 x / day Hypoglycemia episodes : nor recent        Hemoglobin A1c has ranged from 7.9% in 2020, peaking at 10.7% in 12/2019. Patient required assistance for hypoglycemia: no Patient has required hospitalization within the last 1 year from hyper or hypoglycemia: no  In terms of diet, the patient eats 2 meals a day, snacks 3 x a day,drinks juices    Works for [employer] , so on  the road    HYPERCALCEMIA HISTORY:  Ms. Homesley indicates that she was first diagnosed with hypercalcemia during routine labs with a serum calcium of 11.5 mg/dL in 06/2019.    Since that time, she has experienced symptoms of constipation, polyuria, polydipsia, generalized weakness, diffuse muscle pains . She denies use of over the counter calcium (including supplements, Tums, Rolaids, or other calcium containing antacids), lithium, or vitamin D supplements, but she is on HCTZ .  She denies history of kidney stones, kidney disease, liver disease, granulomatous disease. She denies  osteoporosis or prior fractures. Daily dietary calcium intake: 1 servings . She denies  family history of osteoporosis, parathyroid disease, thyroid disease.         HOME DIABETES REGIMEN: Ozempic 0.5 mg weekly  Metformin 500 mg XR 3 tabs with Supper      Statin: no ACE-I/ARB:  yes Prior Diabetic Education: yes   METER DOWNLOAD SUMMARY: Did not bring      DIABETIC COMPLICATIONS: Microvascular complications:     Denies: neuropathy, CKD, retinopathy  Last eye exam: Completed 2017  Macrovascular complications:    Denies: CAD, PVD, CVA   PAST HISTORY: Past Medical History:  Past Medical History:  Diagnosis Date  . Arthritis   . Depression   . Diabetes mellitus without complication ([place])   . Dyspnea   . Fibroid   . Gestational diabetes   . Hypertension   . Polycystic ovarian disease   . Pregnancy induced hypertension   . Preterm labor   . Vaginal Pap smear, abnormal    Past Surgical History:  Past Surgical History:  Procedure Laterality Date  . CESAREAN SECTION    . OVARIAN CYST DRAINAGE    . OVARIAN CYST DRAINAGE    . WISDOM TOOTH EXTRACTION        Social History:  reports that she has never smoked. She has never used smokeless tobacco. She reports that she does not drink alcohol and does not use drugs. Family History:  Family History  Problem Relation Age of Onset  . Cancer Mother   . Hypertension Mother   . Hypertension Brother   . Hypertension Maternal Aunt   . Hypertension Maternal Uncle   . Hypertension Maternal Grandmother   . Cancer Maternal Grandfather      HOME MEDICATIONS: Allergies as of 11/03/2020      Reactions   Pineapple  Swelling   Shellfish Allergy Shortness Of Breath      Medication List       Accurate as of Nov 03, 2020  3:36 PM. If you have any questions, ask your nurse or doctor.        STOP taking these medications   albuterol 108 (90 Base) MCG/ACT inhaler Commonly known as: VENTOLIN HFA Stopped by: Dorita Sciara, MD   insulin aspart 100 UNIT/ML injection Commonly known as: novoLOG Stopped by: Dorita Sciara, MD   progesterone 200 MG capsule Commonly known as: PROMETRIUM Stopped by: Dorita Sciara, MD     TAKE these medications   amLODipine 5 MG tablet Commonly  known as: NORVASC amlodipine 5 mg tablet  TAKE 1 TABLET BY MOUTH ONCE DAILY   buPROPion 150 MG 24 hr tablet Commonly known as: WELLBUTRIN XL Take 1 tablet by mouth daily.   Concept OB 130-92.4-1 MG Caps Take 1 capsule by mouth daily before breakfast.   hydrochlorothiazide 12.5 MG tablet Commonly known as: HYDRODIURIL hydrochlorothiazide 12.5 mg tablet  TAKE 1 TABLET BY MOUTH ONCE DAILY   Lo Loestrin Fe 1 MG-10 MCG / 10 MCG tablet Generic drug: Norethindrone-Ethinyl Estradiol-Fe Biphas Take 1 tablet by mouth daily.   metFORMIN 500 MG 24 hr tablet Commonly known as: Glucophage XR Take 3 tablets (1,500 mg total) by mouth daily after supper.   Ozempic (0.25 or 0.5 MG/DOSE) 2 MG/1.5ML Sopn Generic drug: Semaglutide(0.25 or 0.5MG /DOS) Inject 0.5 mg into the skin once a week.   valsartan 40 MG tablet Commonly known as: DIOVAN valsartan 40 mg tablet        ALLERGIES: Allergies  Allergen Reactions  . Pineapple Swelling  . Shellfish Allergy Shortness Of Breath     REVIEW OF SYSTEMS: A comprehensive ROS was conducted with the patient and is negative except as per HPI and below:  Review of Systems  Constitutional: Positive for malaise/fatigue.  Endo/Heme/Allergies: Positive for polydipsia.  Psychiatric/Behavioral: Positive for depression.      OBJECTIVE:   VITAL SIGNS: BP 140/86   Pulse 95   Ht 5\' 7"  (1.702 m)   Wt 217 lb 8 oz (98.7 kg)   SpO2 97%   BMI 34.07 kg/m    PHYSICAL EXAM:  General: Pt appears well and is in NAD  Neck: General: Supple without adenopathy or carotid bruits. Thyroid: Thyroid size prominenet  Lungs: Clear with good BS bilat with no rales, rhonchi, or wheezes  Heart: RRR with normal S1 and S2 and no gallops; no murmurs; no rub  Abdomen: Normoactive bowel sounds, soft, nontender, without masses or organomegaly palpable  Extremities:  Lower extremities - No pretibial edema. No lesions.  Skin: Normal texture and temperature to palpation.  No rash noted. No Acanthosis nigricans/skin tags. No lipohypertrophy.  Neuro: MS is good with appropriate affect, pt is alert and Ox3      DATA REVIEWED:  Results for SHINIQUA, GROSECLOSE (MRN [id number]) as of 11/04/2020 13:59  Ref. Range 11/03/2020 16:22  Sodium Latest Ref Range: 135 - 145 mEq/L 134 (L)  Potassium Latest Ref Range: 3.5 - 5.1 mEq/L 4.0  Chloride Latest Ref Range: 96 - 112 mEq/L 100  CO2 Latest Ref Range: 19 - 32 mEq/L 26  Glucose Latest Ref Range: 70 - 99 mg/dL 201 (H)  BUN Latest Ref Range: 6 - 23 mg/dL 12  Creatinine Latest Ref Range: 0.40 - 1.20 mg/dL 0.80  Calcium Latest Ref Range: 8.4 - 10.5 mg/dL 11.5 (H)  Albumin Latest Ref  Range: 3.5 - 5.2 g/dL 4.3  GFR Latest Ref Range: >60.00 mL/min 95.06  MICROALB/CREAT RATIO Latest Ref Range: 0.0 - 30.0 mg/g 0.9  VITD Latest Ref Range: 30.00 - 100.00 ng/mL 9.18 (L)  PTH, Intact Latest Ref Range: 16 - 77 pg/mL 102 (H)  Creatinine,U Latest Units: mg/dL 81.3  Microalb, Ur Latest Ref Range: 0.0 - 1.9 mg/dL <0.7    ASSESSMENT / PLAN / RECOMMENDATIONS:   1) Type 2 Diabetes Mellitus, Poorly controlled, Without complications - Most recent A1c of 8.0 %. Goal A1c < 7.0 %.    Plan: GENERAL: I have discussed with the patient the pathophysiology of diabetes. We went over the natural progression of the disease. We talked about both insulin resistance and insulin deficiency. We stressed the importance of lifestyle changes including diet and exercise. I explained the complications associated with diabetes including retinopathy, nephropathy, neuropathy as well as increased risk of cardiovascular disease. We went over the benefit seen with glycemic control.    I explained to the patient that diabetic patients are at higher than normal risk for amputations.    Pt to avoid conception until a1c 7.0 %   She has not started Metformin yet, titration provided today   She was advised to stop Ozempic prior to conception , as she may need  insulin     MEDICATIONS: Continue Ozempic 0.5 mg weekly  - Metformin 500 mg XR  1 tablet with Supper for 1 weeks, if no side effects, increase to 2 tablet with Supper for another week, if no side effects after a week take 3 tablets with supper    EDUCATION / INSTRUCTIONS:  BG monitoring instructions: Patient is instructed to check her blood sugars 1 times a day, fasting .  Call [facility] if: BG persistently < 70 . I reviewed the Rule of 15 for the treatment of hypoglycemia in detail with the patient. Literature supplied.   2) Diabetic complications:   Eye: Does not have known diabetic retinopathy.   Neuro/ Feet: Does not have known diabetic peripheral neuropathy.  Renal: Patient does not have known baseline CKD. She is  on an ACEI/ARB at present.Check urine albumin/creatinine ratio yearly starting at time of diagnosis.  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  - Pt will need further work up to determine surgical candidacy  Recommendations - STOP Hydrochlorothiazide  - Stay hydrated  - Avoid over the counter calcium tablets but maintain calcium in your food 2-3 servings daily   4) Vitamin D deficiency:   - Will replenish with OTC Vitamin D3 at 2000 iu daily    F/U in 3 months     Signed electronically by: Mack Guise, MD  [facility]  [facility] [address] [place], [place] [address] Phone: [phone] FAX: [phone]   CC: Jordan Hawks, NP [place] [address] Phone: [phone]  Fax: [phone]    Return to Endocrinology clinic as below: Future Appointments  Date Time Provider [place]  11/06/2020  9:00 AM [facility]

## 2020-11-03 NOTE — Patient Instructions (Addendum)
-   Continue Ozempic 0.5 mg weekly  - Metformin 500 mg XR  1 tablet with Supper for 1 weeks, if no side effects, increase to 2 tablet with Supper for another week, if no side effects after a week take 3 tablets with supper  - STOP Hydrochlorothiazide  - Stay hydrated  - Avoid over the counter calcium tablets but maintain calcium in your food 2-3 servings daily     Choose healthy, lower carb lower calorie snacks: toss salad, vegetables, cottage cheese, peanut butter, low fat cheese / string cheese, lower sodium deli meat, tuna salad or chicken salad     HOW TO TREAT LOW BLOOD SUGARS (Blood sugar LESS THAN 70 MG/DL)  Please follow the RULE OF 15 for the treatment of hypoglycemia treatment (when your (blood sugars are less than 70 mg/dL)    STEP 1: Take 15 grams of carbohydrates when your blood sugar is low, which includes:   3-4 GLUCOSE TABS  OR  3-4 OZ OF JUICE OR REGULAR SODA OR  ONE TUBE OF GLUCOSE GEL     STEP 2: RECHECK blood sugar in 15 MINUTES STEP 3: If your blood sugar is still low at the 15 minute recheck --> then, go back to STEP 1 and treat AGAIN with another 15 grams of carbohydrates.

## 2020-11-04 ENCOUNTER — Ambulatory Visit: Payer: Medicaid Other | Admitting: Internal Medicine

## 2020-11-04 DIAGNOSIS — E282 Polycystic ovarian syndrome: Secondary | ICD-10-CM | POA: Insufficient documentation

## 2020-11-04 LAB — BASIC METABOLIC PANEL
BUN: 12 mg/dL (ref 6–23)
CO2: 26 mEq/L (ref 19–32)
Calcium: 11.5 mg/dL — ABNORMAL HIGH (ref 8.4–10.5)
Chloride: 100 mEq/L (ref 96–112)
Creatinine, Ser: 0.8 mg/dL (ref 0.40–1.20)
GFR: 95.06 mL/min (ref >60.00)
Glucose, Bld: 201 mg/dL — ABNORMAL HIGH (ref 70–99)
Potassium: 4 mEq/L (ref 3.5–5.1)
Sodium: 134 mEq/L — ABNORMAL LOW (ref 135–145)

## 2020-11-04 LAB — PARATHYROID HORMONE, INTACT (NO CA): PTH: 102 pg/mL — ABNORMAL HIGH (ref 16–77)

## 2020-11-04 LAB — VITAMIN D 25 HYDROXY (VIT D DEFICIENCY, FRACTURES): VITD: 9.18 ng/mL — ABNORMAL LOW (ref 30.00–100.00)

## 2020-11-04 LAB — MICROALBUMIN / CREATININE URINE RATIO
Creatinine,U: 81.3 mg/dL
Microalb Creat Ratio: 0.9 mg/g (ref 0.0–30.0)
Microalb, Ur: <0.7 mg/dL (ref 0.0–1.9)

## 2020-11-04 LAB — ALBUMIN: Albumin: 4.3 g/dL (ref 3.5–5.2)

## 2020-11-04 MED ORDER — METFORMIN HCL ER 500 MG PO TB24: 1500.0000 mg | ORAL_TABLET | Freq: Every day | ORAL | 2 refills | Status: DC

## 2020-11-04 MED ORDER — OZEMPIC (0.25 OR 0.5 MG/DOSE) 2 MG/1.5ML ~~LOC~~ SOPN: 0.5000 mg | PEN_INJECTOR | SUBCUTANEOUS | 1 refills | Status: DC

## 2020-11-05 ENCOUNTER — Ambulatory Visit (HOSPITAL_BASED_OUTPATIENT_CLINIC_OR_DEPARTMENT_OTHER): Payer: BC Managed Care – PPO

## 2020-11-06 ENCOUNTER — Other Ambulatory Visit: Payer: Self-pay

## 2020-11-06 ENCOUNTER — Ambulatory Visit (HOSPITAL_BASED_OUTPATIENT_CLINIC_OR_DEPARTMENT_OTHER)
Admission: RE | Admit: 2020-11-06 | Discharge: 2020-11-06 | Disposition: A | Discharge status: Home / Self Care | Payer: BC Managed Care – PPO | Source: Ambulatory Visit | Attending: Obstetrics | Admitting: Obstetrics

## 2020-11-06 DIAGNOSIS — N926 Irregular menstruation, unspecified: Secondary | ICD-10-CM | POA: Insufficient documentation

## 2020-11-06 IMAGING — US US PELVIS COMPLETE WITH TRANSVAGINAL
1 series · 13 of 25 positions shown · non-contrast
Comparison: None

CLINICAL DATA: Irregular menses, polycystic ovarian syndrome,
history of fibroids; LMP [DATE]



[Series 1: us pelvis complete with transvaginal · 77 acquisitions, 13 frames shown]
[im 1/77]
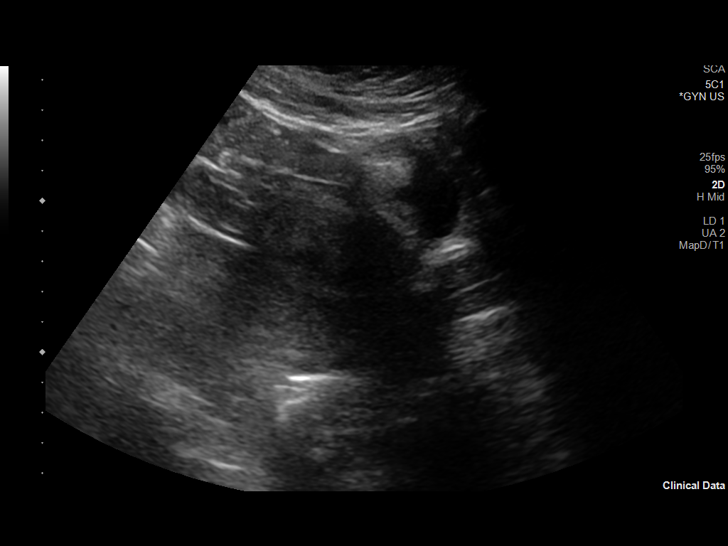
[im 7/77]
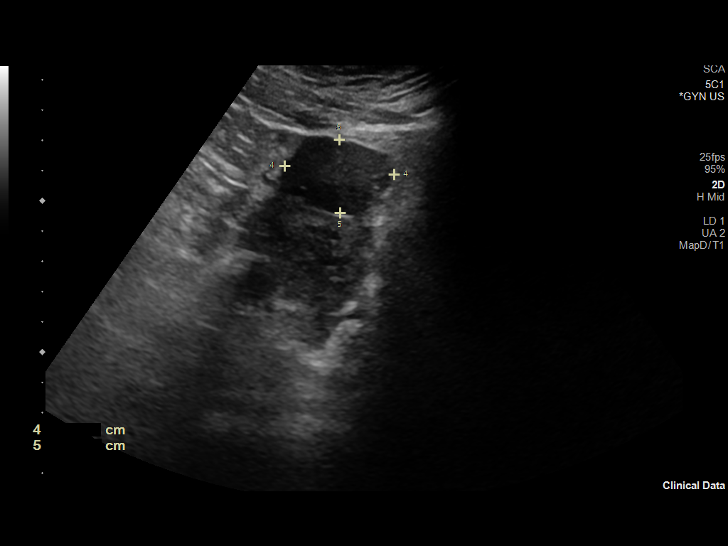
[im 13/77]
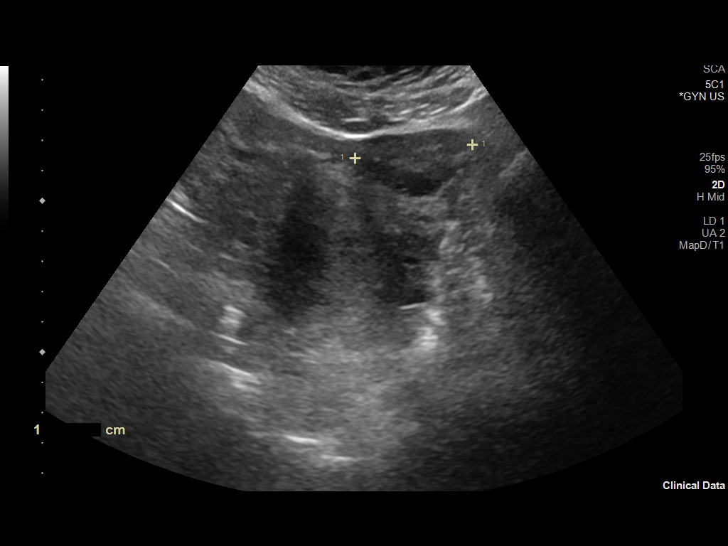
[im 20/77]
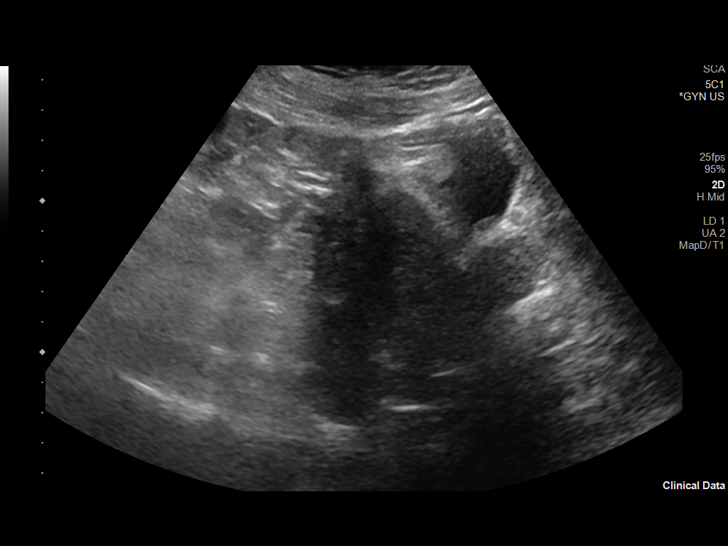
[im 26/77]
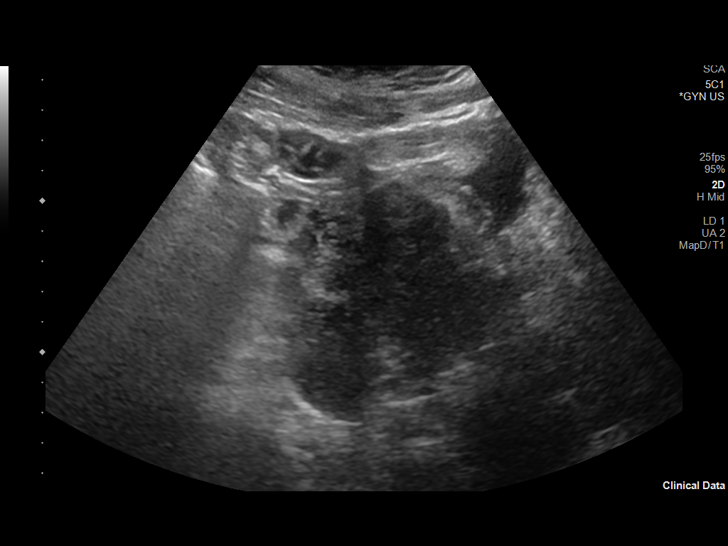
[im 32/77]
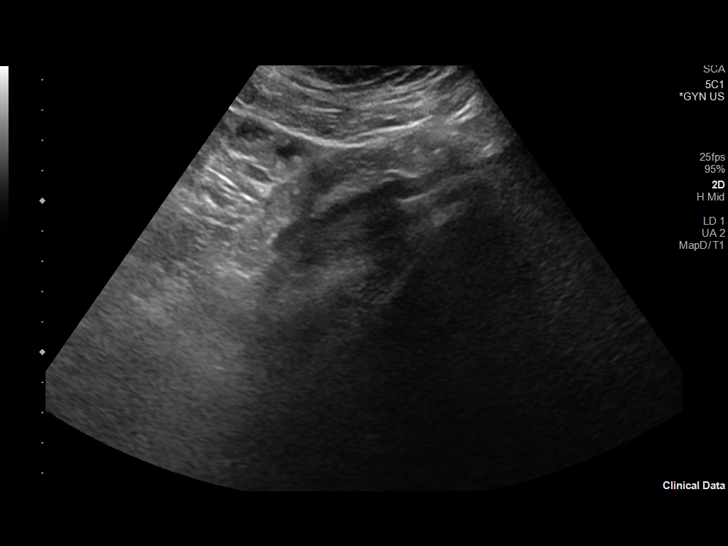
[im 39/77]
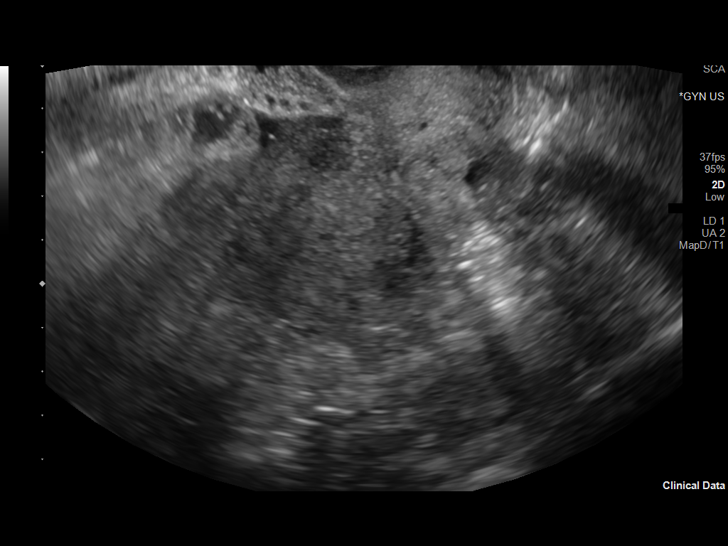
[im 45/77]
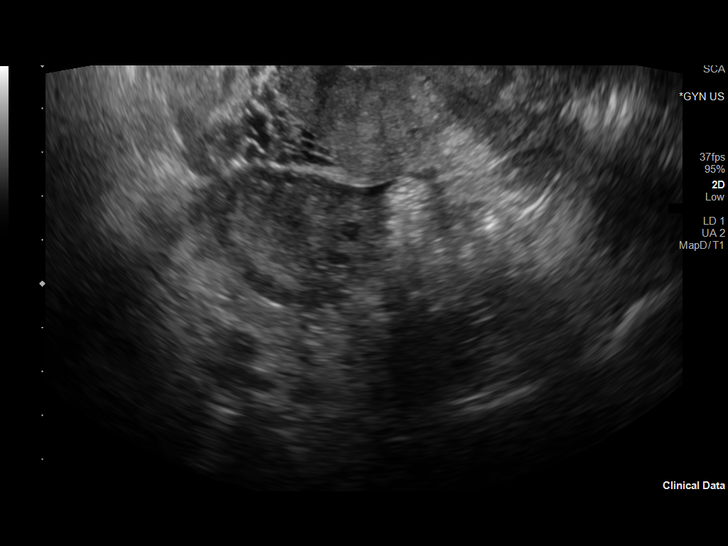
[im 51/77]
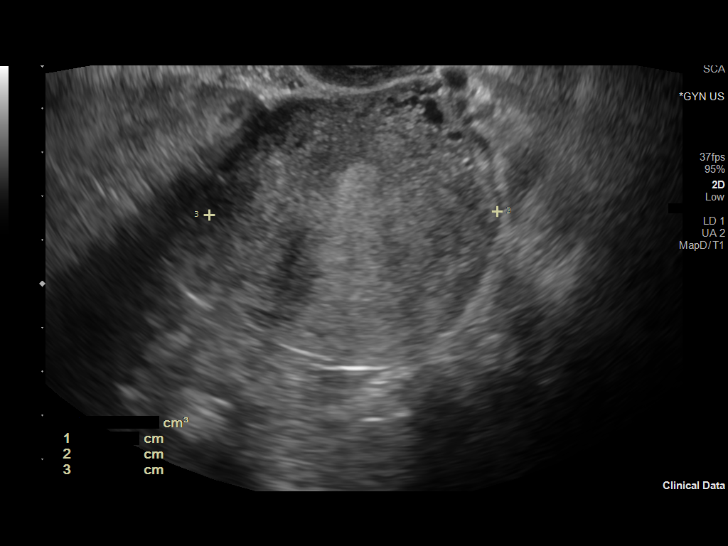
[im 58/77]
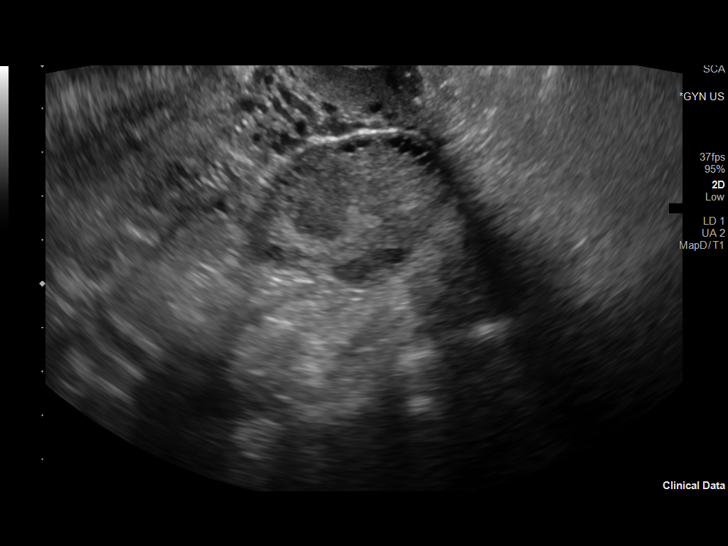
[im 64/77]
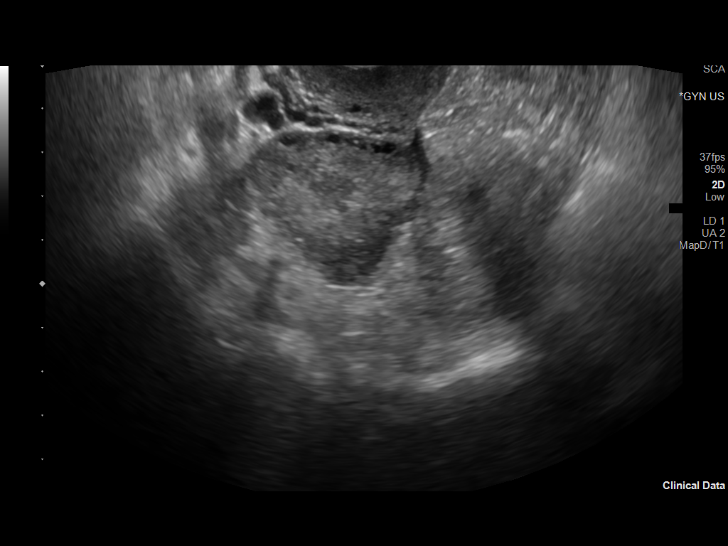
[im 70/77]
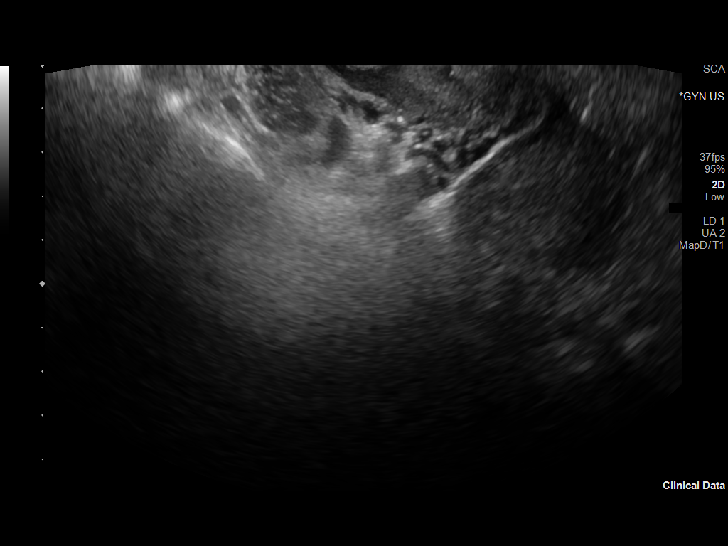
[im 77/77]
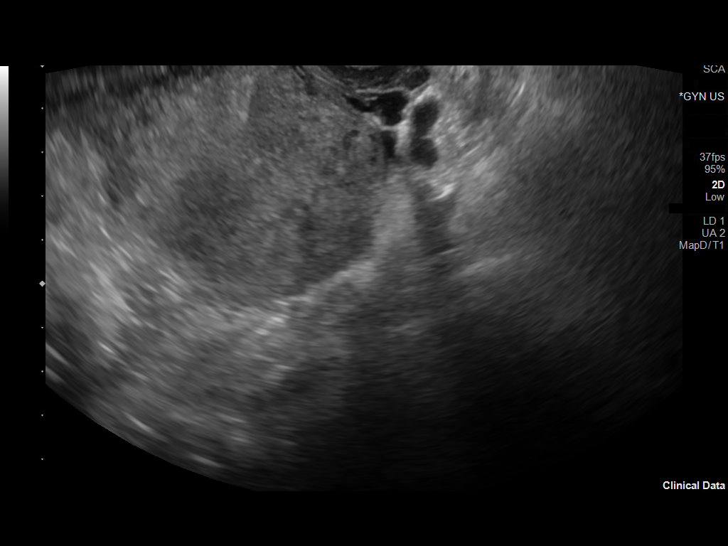

[13 of 25 positions shown; findings below may reference images not displayed]

FINDINGS: Uterus

Measurements: 8.5 x 6.1 x 6.6 cm = volume: 176 mL. Anteverted.
Heterogeneous myometrium. Exophytic leiomyoma at upper RIGHT uterus
3.8 x 3.6 x 3.2 cm. No additional discrete masses.

Endometrium

Thickness: 9 mm.  No endometrial fluid or focal abnormality

Right ovary

Measurements: 5.3 x 4.1 x 3.5 cm = volume: 39.7 mL. Numerous
peripheral follicles. Mildly enlarged. Appearance suggestive of
polycystic ovarian syndrome. No dominant mass.

Left ovary

Not visualized, likely obscured by bowel

Other findings

No free pelvic fluid.  No adnexal masses.
IMPRESSION: Exophytic leiomyoma at upper RIGHT uterus anteriorly 3.8 cm
diameter.

Enlarged RIGHT ovary with numerous peripheral follicles, appearance
suggestive of polycystic ovarian syndrome.

Nonvisualization of LEFT ovary.

## 2021-02-06 ENCOUNTER — Encounter: Payer: Self-pay | Admitting: Internal Medicine

## 2021-02-06 ENCOUNTER — Other Ambulatory Visit: Payer: Self-pay

## 2021-02-06 ENCOUNTER — Ambulatory Visit (INDEPENDENT_AMBULATORY_CARE_PROVIDER_SITE_OTHER): Payer: Medicaid Other | Admitting: Internal Medicine

## 2021-02-06 VITALS — BP 138/82 | HR 93 | Ht 67.5 in | Wt 221.0 lb

## 2021-02-06 DIAGNOSIS — E559 Vitamin D deficiency, unspecified: Secondary | ICD-10-CM | POA: Diagnosis not present

## 2021-02-06 DIAGNOSIS — E1165 Type 2 diabetes mellitus with hyperglycemia: Secondary | ICD-10-CM | POA: Diagnosis not present

## 2021-02-06 LAB — BASIC METABOLIC PANEL
BUN: 11 mg/dL (ref 6–23)
CO2: 30 mEq/L (ref 19–32)
Calcium: 10.9 mg/dL — ABNORMAL HIGH (ref 8.4–10.5)
Chloride: 102 mEq/L (ref 96–112)
Creatinine, Ser: 0.82 mg/dL (ref 0.40–1.20)
GFR: 92.12 mL/min (ref >60.00)
Glucose, Bld: 167 mg/dL — ABNORMAL HIGH (ref 70–99)
Potassium: 4 mEq/L (ref 3.5–5.1)
Sodium: 136 mEq/L (ref 135–145)

## 2021-02-06 LAB — POCT GLYCOSYLATED HEMOGLOBIN (HGB A1C): Hemoglobin A1C: 7.7 % — AB (ref 4.0–5.6)

## 2021-02-06 LAB — TSH: TSH: 0.77 u[IU]/mL (ref 0.35–5.50)

## 2021-02-06 LAB — VITAMIN D 25 HYDROXY (VIT D DEFICIENCY, FRACTURES): VITD: 9.67 ng/mL — ABNORMAL LOW (ref 30.00–100.00)

## 2021-02-06 LAB — ALBUMIN: Albumin: 3.8 g/dL (ref 3.5–5.2)

## 2021-02-06 LAB — T4, FREE: Free T4: 0.94 ng/dL (ref 0.60–1.60)

## 2021-02-06 NOTE — Patient Instructions (Addendum)
-   Stay hydrated  - Avoid over the counter calcium tablets but maintain calcium in your food 2-3 servings daily      24-Hour Urine Collection  You will be collecting your urine for a 24-hour period of time. Your timer starts with your first urine of the morning (For example - If you first pee at [place], your timer will start at [place]) [place] away your first urine of the morning Collect your urine every time you pee for the next 24 hours STOP your urine collection 24 hours after you started the collection (For example - You would stop at 9AM the day after you started)

## 2021-02-06 NOTE — Progress Notes (Signed)
Name: Emma Brown  Age/ Sex: 35 y.o., female   MRN/ DOB: [id number], 03-Apr-1986     PCP: Jordan Hawks, FNP   Reason for Endocrinology Evaluation: Type 2 Diabetes Mellitus  Initial Endocrine Consultative Visit: 11/03/2020    PATIENT IDENTIFIER: [name] is a 35 y.o. female with a past medical history of T2DM, and HTN. The patient has followed with Endocrinology clinic since 11/03/2020 for consultative assistance with management of her diabetes.  DIABETIC HISTORY:  [name] was diagnosed with DM in 2017,  Was on insulin during pregnancy 2020 . Her hemoglobin A1c has ranged from 7.9% in 2020, peaking at 10.7% in 12/2019.   On her initial clinic to our office she had an A1c of 8% , she was on Ozempic . She was advised to AVOID conception until her A1c at <7.0 %  . She was also advised to stop Ozempic prior to conception . Metformin was added as it was on her list but she was not taking it.   Works for [employer] , does a lot of traveling   HYPERCALCEMIA HISTORY:   Ms. Lean indicates that she was first diagnosed with hypercalcemia during routine labs with a serum calcium of 11.5 mg/dL in 06/2019. She was on HCTZ at the time   No hx of renal stones, or osteoporosis nor fractures  On her initial visit we stopped HCTZ  SUBJECTIVE:   During the last visit (11/03/2020): A1c 8.0 % continued ozempic and started metformin       Today (02/06/2021): [name] is here for a follow up on diabetes management and hyperparathyroidism. She checks her blood sugars occasionally  times daily.   Not taking vitamin D  She is on Loestrin   Her PCP increased her Ozempic to 1 mg and at the same time she took metformin , so she stopped Metformin as she wasn't sure what was causing upset stomach   Symptoms are improving but she is on peptobismol    HOME DIABETES REGIMEN:  Ozempic 1 mg weekly  Metformin 500 mg XR 2 tabs BID - not taking      Statin: no ACE-I/ARB:  no Prior Diabetic Education: yes   METER DOWNLOAD SUMMARY: did not    DIABETIC COMPLICATIONS: Microvascular complications:   Denies: neuropathy, CKD and retinopathy Last Eye Exam: Completed 2017  Macrovascular complications:   Denies: CAD, CVA, PVD   HISTORY:  Past Medical History:  Past Medical History:  Diagnosis Date   Arthritis    Depression    Diabetes mellitus without complication ([place])    Dyspnea    Fibroid    Gestational diabetes    Hypertension    Polycystic ovarian disease    Pregnancy induced hypertension    Preterm labor    Vaginal Pap smear, abnormal    Past Surgical History:  Past Surgical History:  Procedure Laterality Date   CESAREAN SECTION     OVARIAN CYST DRAINAGE     OVARIAN CYST DRAINAGE     WISDOM TOOTH EXTRACTION     Social History:  reports that she has never smoked. She has never used smokeless tobacco. She reports that she does not drink alcohol and does not use drugs. Family History:  Family History  Problem Relation Age of Onset   Cancer Mother    Hypertension Mother    Hypertension Brother    Hypertension Maternal Aunt    Hypertension Maternal Uncle    Hypertension Maternal Grandmother    Cancer Maternal  Grandfather      HOME MEDICATIONS: Allergies as of 02/06/2021       Reactions   Pineapple Swelling   Shellfish Allergy Shortness Of Breath        Medication List        Accurate as of February 06, 2021  7:32 AM. If you have any questions, ask your nurse or doctor.          amLODipine 5 MG tablet Commonly known as: NORVASC amlodipine 5 mg tablet  TAKE 1 TABLET BY MOUTH ONCE DAILY   buPROPion 150 MG 24 hr tablet Commonly known as: WELLBUTRIN XL Take 1 tablet by mouth daily.   Concept OB 130-92.4-1 MG Caps Take 1 capsule by mouth daily before breakfast.   Lo Loestrin Fe 1 MG-10 MCG / 10 MCG tablet Generic drug: Norethindrone-Ethinyl Estradiol-Fe Biphas Take 1 tablet by mouth daily.   metFORMIN 500 MG 24  hr tablet Commonly known as: Glucophage XR Take 3 tablets (1,500 mg total) by mouth daily with supper.   Ozempic (0.25 or 0.5 MG/DOSE) 2 MG/1.5ML Sopn Generic drug: Semaglutide(0.25 or 0.5MG/DOS) Inject 0.5 mg into the skin once a week.   valsartan 40 MG tablet Commonly known as: DIOVAN valsartan 40 mg tablet         OBJECTIVE:   Vital Signs: BP 138/82   Pulse 93   Ht 5' 7.5" (1.715 m)   Wt 221 lb (100.2 kg)   SpO2 99%   BMI 34.10 kg/m   Wt Readings from Last 3 Encounters:  11/03/20 217 lb 8 oz (98.7 kg)  10/23/20 220 lb (99.8 kg)  06/15/13 216 lb (98 kg)     Exam: General: Pt appears well and is in NAD  Neck: General: Supple without adenopathy. Thyroid: Thyromegaly noted   Lungs: Clear with good BS bilat with no rales, rhonchi, or wheezes  Heart: RRR, + systolic murmur  Abdomen: Normoactive bowel sounds, soft, nontender, without masses or organomegaly palpable  Extremities: No pretibial edema.   Neuro: MS is good with appropriate affect, pt is alert and Ox3      DATA REVIEWED:  Lab Results  Component Value Date   HGBA1C 8.0 (A) 11/03/2020   Lab Results  Component Value Date   MICROALBUR <0.7 11/03/2020   CREATININE 0.80 11/03/2020   Lab Results  Component Value Date   MICRALBCREAT 0.9 11/03/2020      ASSESSMENT / PLAN / RECOMMENDATIONS:   1) Type 2 Diabetes Mellitus, Sub-OPtimally controlled, Without complications - Most recent A1c of 7.7 %. Goal A1c < 7.0 %.    - Since her last visit here, she has met with her PCP and her Ozempic dose was increased from 0.5 mg to 1 mg weekly, this was about the same time I started her on Metformin and she had issues with nausea and she was busy with too much going on with her etc so she really did not continue the metformin  - It appears that she would like her PCP to handle   - A1c is trending down    MEDICATIONS: Continue Ozempic 1 mg weekly    2) PTH - Mediated Hypercalcemia :   - Most likely  Primary Hyperparathyroidism  - Pt will need further work up to determine surgical candidacy  - Unfortunately unable to proceed with 24-hr urine collection for urinary calcium excretion due to low vitamin D, this will skew her results - I explained this to her      Recommendations -  Stay hydrated  - Avoid over the counter calcium tablets but maintain calcium in your food 2-3 servings daily       4) Vitamin D deficiency:   - She was supposed to start OTC vitamin D 3 months ago but she never did. I would like to avoid the high dose ergocalciferol , so not to exacerbate her hypercalcemia.  - She will be advised AGAIN to start OTC Vitamin D3 at 2000 iu daily   - She understands normalization of vitamin D is important to proceed with 24- hr urine collection for calcium     F/U in 4 months     Signed electronically by: Mack Guise, MD  [facility]  [address] [address] [place], [place] [address] Phone: [phone] FAX: [phone]   CC: Jordan Hawks, [place] [address] Phone: [phone]  Fax: [phone]  Return to Endocrinology clinic as below: Future Appointments  Date Time Provider [place]  02/06/2021 10:50 AM Sequoyah Counterman, Melanie Crazier, MD [facility] None

## 2021-02-09 LAB — CALCIUM, IONIZED: Calcium, Ion: 6.43 mg/dL — ABNORMAL HIGH (ref 4.8–5.6)

## 2021-02-09 LAB — PARATHYROID HORMONE, INTACT (NO CA): PTH: 107 pg/mL — ABNORMAL HIGH (ref 16–77)

## 2021-06-10 ENCOUNTER — Ambulatory Visit: Payer: Medicaid Other | Admitting: Internal Medicine

## 2021-07-13 ENCOUNTER — Ambulatory Visit: Payer: Medicaid Other | Admitting: Internal Medicine

## 2021-07-13 ENCOUNTER — Other Ambulatory Visit: Payer: Self-pay

## 2021-07-13 ENCOUNTER — Encounter: Payer: Self-pay | Admitting: Internal Medicine

## 2021-07-13 VITALS — BP 136/70 | HR 80 | Ht 67.5 in | Wt 215.0 lb

## 2021-07-13 DIAGNOSIS — N926 Irregular menstruation, unspecified: Secondary | ICD-10-CM

## 2021-07-13 DIAGNOSIS — E559 Vitamin D deficiency, unspecified: Secondary | ICD-10-CM | POA: Insufficient documentation

## 2021-07-13 DIAGNOSIS — E1165 Type 2 diabetes mellitus with hyperglycemia: Secondary | ICD-10-CM

## 2021-07-13 DIAGNOSIS — E010 Iodine-deficiency related diffuse (endemic) goiter: Secondary | ICD-10-CM | POA: Diagnosis not present

## 2021-07-13 DIAGNOSIS — E213 Hyperparathyroidism, unspecified: Secondary | ICD-10-CM

## 2021-07-13 LAB — VITAMIN D 25 HYDROXY (VIT D DEFICIENCY, FRACTURES): VITD: 11.8 ng/mL — ABNORMAL LOW (ref 30.00–100.00)

## 2021-07-13 LAB — COMPREHENSIVE METABOLIC PANEL
ALT: 13 U/L (ref 0–35)
AST: 14 U/L (ref 0–37)
Albumin: 4.4 g/dL (ref 3.5–5.2)
Alkaline Phosphatase: 111 U/L (ref 39–117)
BUN: 9 mg/dL (ref 6–23)
CO2: 27 mEq/L (ref 19–32)
Calcium: 10.9 mg/dL — ABNORMAL HIGH (ref 8.4–10.5)
Chloride: 104 mEq/L (ref 96–112)
Creatinine, Ser: 0.7 mg/dL (ref 0.40–1.20)
GFR: 111.04 mL/min (ref >60.00)
Glucose, Bld: 83 mg/dL (ref 70–99)
Potassium: 4.4 mEq/L (ref 3.5–5.1)
Sodium: 136 mEq/L (ref 135–145)
Total Bilirubin: 0.4 mg/dL (ref 0.2–1.2)
Total Protein: 6.9 g/dL (ref 6.0–8.3)

## 2021-07-13 LAB — T4, FREE: Free T4: 0.86 ng/dL (ref 0.60–1.60)

## 2021-07-13 LAB — TSH: TSH: 1.16 u[IU]/mL (ref 0.35–5.50)

## 2021-07-13 LAB — HEMOGLOBIN A1C: Hgb A1c MFr Bld: 8 % — ABNORMAL HIGH (ref 4.6–6.5)

## 2021-07-13 NOTE — Patient Instructions (Addendum)
-   Stay hydrated  - Avoid over the counter calcium tablets but maintain calcium in your food 2-3 servings daily  - Vitamin D 2000 iu daily    24-Hour Urine Collection  You will be collecting your urine for a 24-hour period of time. Your timer starts with your first urine of the morning (For example - If you first pee at [place], your timer will start at [place]) [place] away your first urine of the morning Collect your urine every time you pee for the next 24 hours STOP your urine collection 24 hours after you started the collection (For example - You would stop at 9AM the day after you started)  [facility] located at [address]. [employer] [place]

## 2021-07-13 NOTE — Progress Notes (Signed)
Name: Averil Digman  Age/ Sex: 36 y.o., female   MRN/ DOB: [id number], 1985-11-11     PCP: Lucianne Lei, MD   Reason for Endocrinology Evaluation: Hypercalcemia   Initial Endocrine Consultative Visit: 11/03/2020    PATIENT IDENTIFIER: Ms. Esly Selvage is a 36 y.o. female with a past medical history of T2DM, and HTN. The patient has followed with Endocrinology clinic since 11/03/2020 for consultative assistance with management of her diabetes.  DIABETIC HISTORY:  Ms. Benbrook was diagnosed with DM in 2017,  Was on insulin during pregnancy 2020 . Her hemoglobin A1c has ranged from 7.9% in 2020, peaking at 10.7% in 12/2019.   On her initial clinic to our office she had an A1c of 8% , she was on Ozempic . She was advised to AVOID conception until her A1c at <7.0 %  . She was also advised to stop Ozempic prior to conception . Metformin was added as it was on her list but she was not taking it.   Her PCP increase her Ozempic which caused GI side effects, this was subsequently switched to [place].  Patient was not taking Jardiance and was complaining of nausea with Trulicity  Patient asked me to manage her diabetes 06/2021, as in the past she has indicated her preference to manage through PCP   HYPERCALCEMIA HISTORY:   Ms. Parisi indicates that she was first diagnosed with hypercalcemia during routine labs with a serum calcium of 11.5 mg/dL in 06/2019. She was on HCTZ at the time   No hx of renal stones, or osteoporosis nor fractures  On her initial visit we stopped HCTZ  SUBJECTIVE:     Today (07/13/2021): Ms. Leyba is here for a follow up on hyperparathyroidism.  She has not been compliant with vitamin D intake. We have not been able to proceed with 24- hr urinary excretion of calcium   She has been off Calcium tablets and consumes 2-3 servings of calcium daily Has been staying hydrated  Polydipsia is improving , no frequency     Vitamin D 2000 iu daily   She  is asking me to manage her DM   Due to GI side effects with Ozempic , she was switched to Trulicity and started on Jardiance  She has an episode of weakness with a BG of 95 mg/dL , so she stopped taking Jardiance, as she attributed a BG of 95 mg/DL as low   Has nausea with Trulicity  LMP 93/7169 - irregular  She has stopped using low estrogen     HISTORY:  Past Medical History:  Past Medical History:  Diagnosis Date   Arthritis    Depression    Diabetes mellitus without complication ([place])    Dyspnea    Fibroid    Gestational diabetes    Hypertension    Polycystic ovarian disease    Pregnancy induced hypertension    Preterm labor    Vaginal Pap smear, abnormal    Past Surgical History:  Past Surgical History:  Procedure Laterality Date   CESAREAN SECTION     OVARIAN CYST DRAINAGE     OVARIAN CYST DRAINAGE     WISDOM TOOTH EXTRACTION     Social History:  reports that she has never smoked. She has never used smokeless tobacco. She reports that she does not drink alcohol and does not use drugs. Family History:  Family History  Problem Relation Age of Onset   Cancer Mother    Hypertension Mother  Hypertension Brother    Hypertension Maternal Aunt    Hypertension Maternal Uncle    Hypertension Maternal Grandmother    Cancer Maternal Grandfather      HOME MEDICATIONS: Allergies as of 07/13/2021       Reactions   Pineapple Swelling   Shellfish Allergy Shortness Of Breath        Medication List        Accurate as of July 13, 2021  2:42 PM. If you have any questions, ask your nurse or doctor.          amLODipine 10 MG tablet Commonly known as: NORVASC Take 10 mg by mouth daily.   buPROPion 150 MG 24 hr tablet Commonly known as: WELLBUTRIN XL Take 1 tablet by mouth daily.   Concept OB 130-92.4-1 MG Caps Take 1 capsule by mouth daily before breakfast.   Lo Loestrin Fe 1 MG-10 MCG / 10 MCG tablet Generic drug: Norethindrone-Ethinyl  Estradiol-Fe Biphas Take 1 tablet by mouth daily.   Trulicity 1.61 WR/6.0AV Sopn Generic drug: Dulaglutide Inject into the skin.   valsartan 160 MG tablet Commonly known as: DIOVAN Take 160 mg by mouth daily.         OBJECTIVE:   Vital Signs: BP 136/70 (BP Location: Left Arm, Patient Position: Sitting, Cuff Size: Small)    Pulse 80    Ht 5' 7.5" (1.715 m)    Wt 215 lb (97.5 kg)    SpO2 98%    BMI 33.18 kg/m   Wt Readings from Last 3 Encounters:  07/13/21 215 lb (97.5 kg)  02/06/21 221 lb (100.2 kg)  11/03/20 217 lb 8 oz (98.7 kg)     Exam: General: Pt appears well and is in NAD  Neck: General: Supple without adenopathy. Thyroid: Thyromegaly noted   Lungs: Clear with good BS bilat with no rales, rhonchi, or wheezes  Heart: RRR, + systolic murmur  Abdomen: Normoactive bowel sounds, soft, nontender, without masses or organomegaly palpable  Extremities: No pretibial edema.   Neuro: MS is good with appropriate affect, pt is alert and Ox3      DATA REVIEWED:  Lab Results  Component Value Date   HGBA1C 7.7 (A) 02/06/2021   HGBA1C 8.0 (A) 11/03/2020     Latest Reference Range & Units 07/13/21 14:53  Sodium 135 - 145 mEq/L 136  Potassium 3.5 - 5.1 mEq/L 4.4  Chloride 96 - 112 mEq/L 104  CO2 19 - 32 mEq/L 27  Glucose 70 - 99 mg/dL 83  BUN 6 - 23 mg/dL 9  Creatinine 0.40 - 1.20 mg/dL 0.70  Calcium 8.4 - 10.5 mg/dL 10.9 (H)  Alkaline Phosphatase 39 - 117 U/L 111  Albumin 3.5 - 5.2 g/dL 4.4  AST 0 - 37 U/L 14  ALT 0 - 35 U/L 13  Total Protein 6.0 - 8.3 g/dL 6.9  Total Bilirubin 0.2 - 1.2 mg/dL 0.4  GFR >60.00 mL/min 111.04  VITD 30.00 - 100.00 ng/mL 11.80 (L)  Prolactin ng/mL 5.2  Hemoglobin A1C 4.6 - 6.5 % 8.0 (H)  Preg, Serum  NEGATIVE  PTH, Intact 16 - 77 pg/mL 115 (H)  TSH 0.35 - 5.50 uIU/mL 1.16  T4,Free(Direct) 0.60 - 1.60 ng/dL 0.86    ASSESSMENT / PLAN / RECOMMENDATIONS:   1) Type 2 Diabetes Mellitus, Poorly  controlled, Without complications -  Most recent A1c of 8.0 %. Goal A1c < 7.0 %.    -Patient intolerant to Ozempic -Intolerant to Trulicity 4.09 mg weekly -She stopped taking  Jardiance as she perceived a BG of 95 mg/DL as " low" .  Today we discussed normal range of BG 70-130 mg/dL and the true definition of hypoglycemia is anything less than 70 mg/DL -I have recommended stopping Trulicity due to persistent nausea and restarting Jardiance -I have informed the patient that GLP-1 agonist and SGLT2 inhibitors have not been tested during pregnancy, and we will need to change these prior to trying for conception    MEDICATIONS: Stop Trulicity Start Jardiance 10 mg daily Start Metformin 00 mg XR daily    2) PTH - Mediated Hypercalcemia :   - Most likely Primary Hyperparathyroidism  - Unfortunately unable to proceed with 24-hr urine collection for urinary calcium excretion due to low vitamin D, this will skew her results -I have again explained to her the importance of being compliant with vitamin D so we can pursue further investigation and management of hypercalcemia. -I did explain the risk of CKD, kidney stones, and osteoporosis with untreated hypercalcemia as I am not sure she was taking this seriously  -I have advised the patient to use pillbox -We will proceed with bone density   Recommendations - Stay hydrated  - Avoid over the counter calcium tablets but maintain calcium in your food 2-3 servings daily      4) Vitamin D deficiency:   -Unfortunately she continues with medication nonadherence with vitamin D intake.  I have been encouraging her to take vitamin D daily since 10/2020 , she understands normalization of vitamin D is important to proceed with 24- hr urine collection for calcium   - Will prescribe ergocalciferol , I was trying to avoid this to prevent worsening hyperglycemia but I am going to prescribe it with close monitoring of her levels    5) Irregular menses:  -This is chronic, she was on COC's but  stopped.  Currently using condoms -We will check beta-hCG, and prolactin -Patient advised to avoid pregnancy while on GLP-1 agonist or SGLT2 inhibitors due to fetal risks, patient to notify this office when she is ready to try conceiving so we can change her to metformin and sulfonylureas - Prolactin is normal   6) Thyromegaly :   - Will proceed with thyroid ultrasound   F/U in 4 months     Signed electronically by: Mack Guise, MD  [facility]  [facility] [address] [place], [place] [address] Phone: [phone] FAX: [phone]   CC: Lucianne Lei, [address] [place] [address] Phone: [phone]  Fax: [phone]  Return to Endocrinology clinic as below: Future Appointments  Date Time Provider [place]  07/16/2021  1:30 PM [facility] DEXA 1 [facility]

## 2021-07-14 LAB — PROLACTIN: Prolactin: 5.2 ng/mL

## 2021-07-14 LAB — HCG, SERUM, QUALITATIVE: Preg, Serum: NEGATIVE

## 2021-07-14 LAB — PARATHYROID HORMONE, INTACT (NO CA): PTH: 115 pg/mL — ABNORMAL HIGH (ref 16–77)

## 2021-07-14 MED ORDER — EMPAGLIFLOZIN 10 MG PO TABS: 10.0000 mg | ORAL_TABLET | Freq: Every day | ORAL | 1 refills | Status: DC

## 2021-07-14 MED ORDER — VITAMIN D (ERGOCALCIFEROL) 1.25 MG (50000 UNIT) PO CAPS: 50000.0000 [IU] | ORAL_CAPSULE | ORAL | 3 refills | Status: DC

## 2021-07-14 MED ORDER — METFORMIN HCL ER 500 MG PO TB24: 500.0000 mg | ORAL_TABLET | Freq: Every day | ORAL | 1 refills | Status: DC

## 2021-07-15 ENCOUNTER — Other Ambulatory Visit (HOSPITAL_COMMUNITY): Payer: Self-pay

## 2021-07-15 ENCOUNTER — Telehealth: Payer: Self-pay

## 2021-07-15 NOTE — Telephone Encounter (Signed)
Patient Advocate Encounter   Received notification from the patient's pharmacy that prior authorization for Jardiance 10mg  tabs is required by his/her insurance [employer] [place].   PA submitted on 07/15/21  Key#: [id number]  Status is pending    [facility] will continue to follow:  Patient Advocate Fax: [phone]

## 2021-07-16 ENCOUNTER — Other Ambulatory Visit: Payer: Self-pay

## 2021-07-16 ENCOUNTER — Other Ambulatory Visit (HOSPITAL_COMMUNITY): Payer: Self-pay

## 2021-07-16 ENCOUNTER — Ambulatory Visit (INDEPENDENT_AMBULATORY_CARE_PROVIDER_SITE_OTHER)
Admission: RE | Admit: 2021-07-16 | Discharge: 2021-07-16 | Disposition: A | Discharge status: Home / Self Care | Payer: Medicaid Other | Source: Ambulatory Visit | Attending: Internal Medicine | Admitting: Internal Medicine

## 2021-07-16 DIAGNOSIS — E213 Hyperparathyroidism, unspecified: Secondary | ICD-10-CM | POA: Diagnosis not present

## 2021-07-16 NOTE — Telephone Encounter (Signed)
Patient Advocate Encounter  Prior Authorization for Jardiance 10mg  tabs has been approved.    PA#  [id number]  Effective dates: 07/16/21 through 07/16/22  Per Test Claim Patients co-pay is $4.00   Spoke with Pharmacy to Process.  Patient Advocate Fax: [phone]

## 2021-07-20 ENCOUNTER — Ambulatory Visit
Admission: RE | Admit: 2021-07-20 | Discharge: 2021-07-20 | Disposition: A | Discharge status: Home / Self Care | Payer: Medicaid Other | Source: Ambulatory Visit | Attending: Internal Medicine | Admitting: Internal Medicine

## 2021-07-20 DIAGNOSIS — E010 Iodine-deficiency related diffuse (endemic) goiter: Secondary | ICD-10-CM

## 2021-07-20 IMAGING — US US THYROID
1 series · 13 of 25 positions shown · non-contrast
Comparison: None.

CLINICAL DATA: Thyromegaly

EXAM:
THYROID ULTRASOUND
TECHNIQUE: Ultrasound examination of the thyroid gland and adjacent soft
tissues was performed.

[Series 1: us thyroid · 0.05mm/px · 13 of 37 slices shown]
[im 1/37]
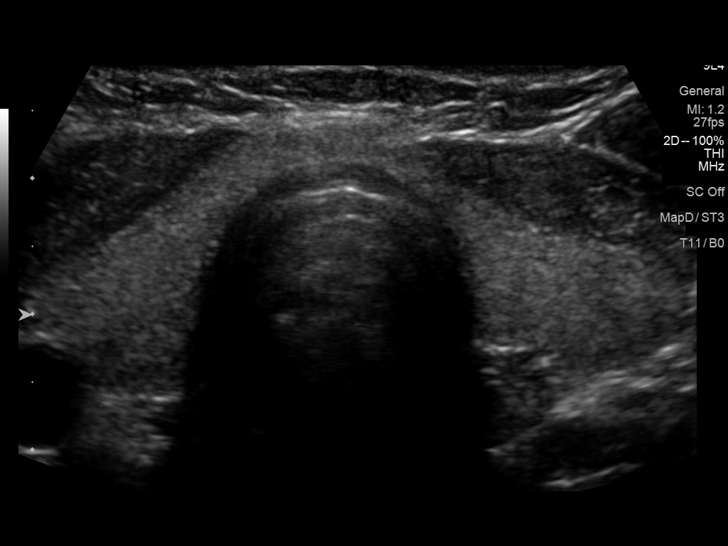
[im 4/37]
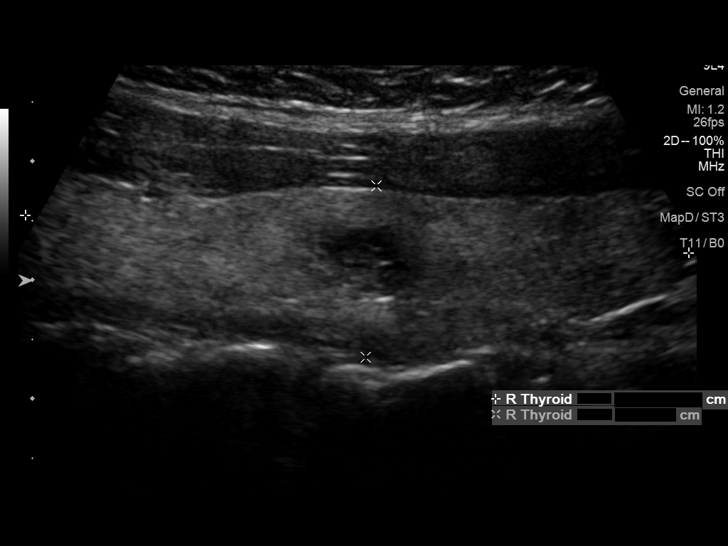
[im 7/37]
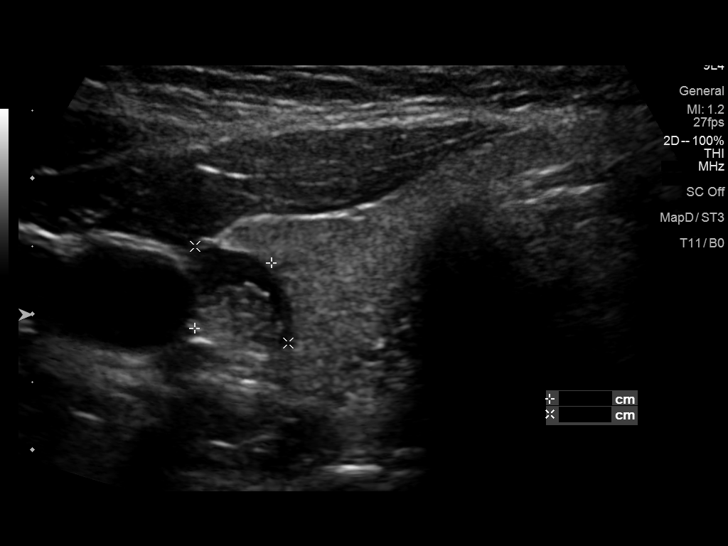
[im 10/37]
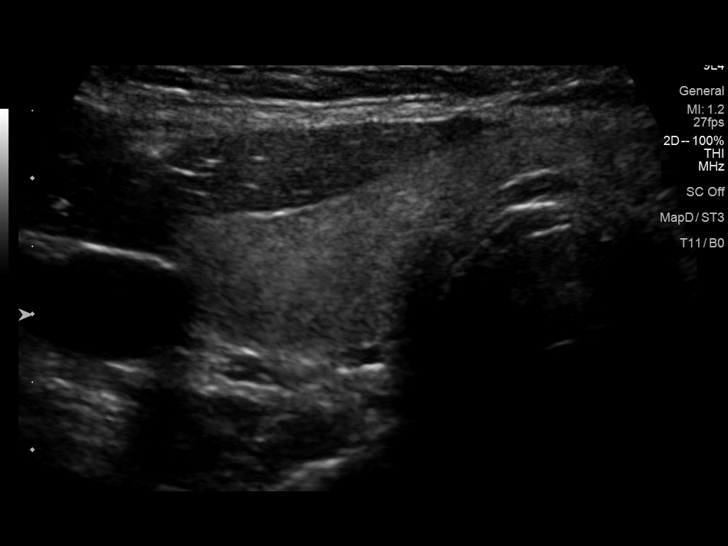
[im 13/37]
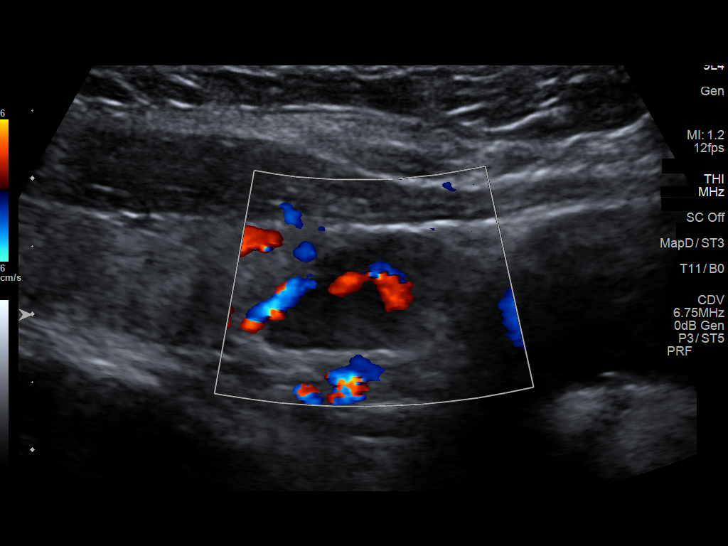
[im 16/37]
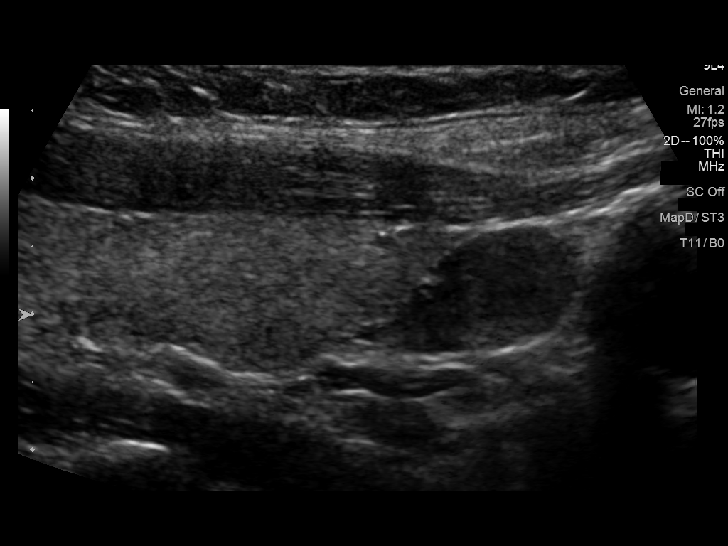
[im 19/37]
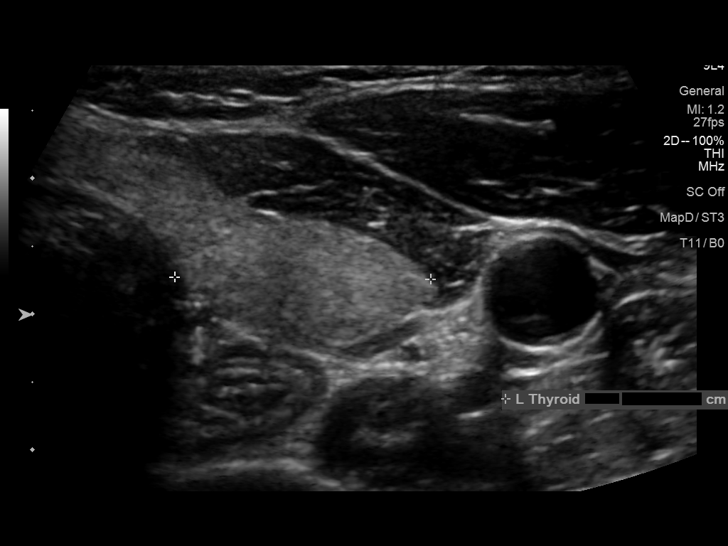
[im 22/37]
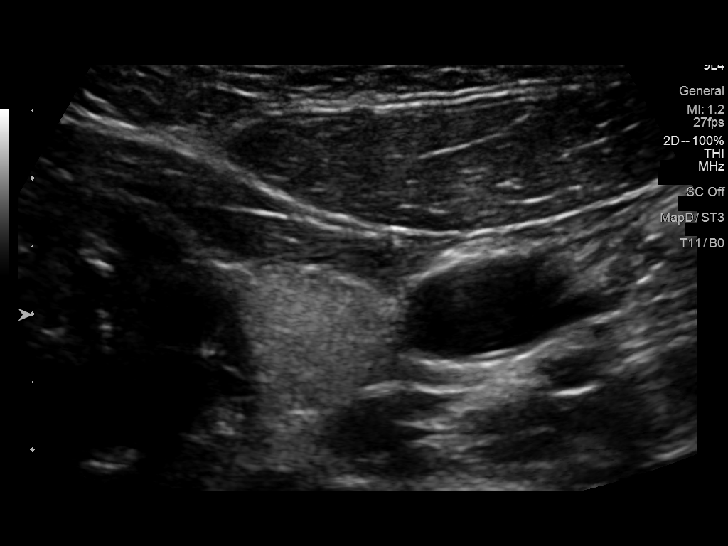
[im 25/37]
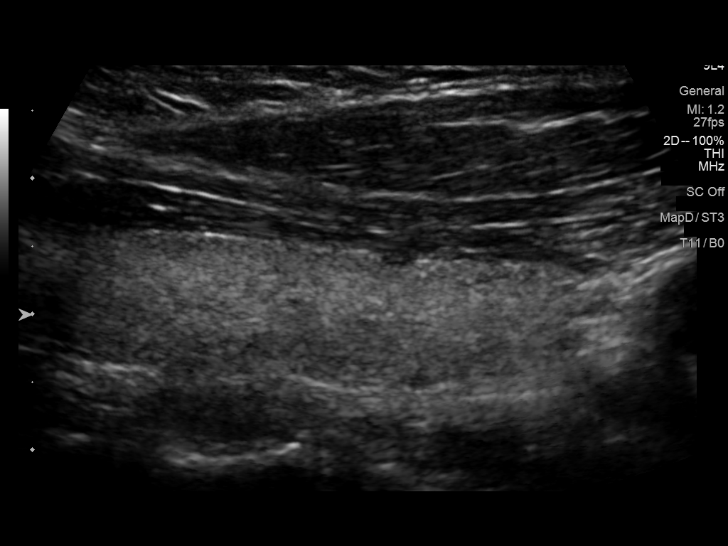
[im 28/37]
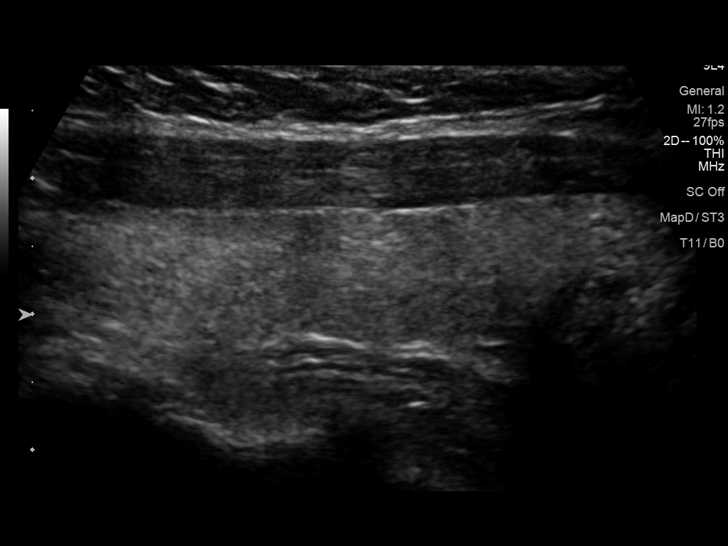
[im 31/37]
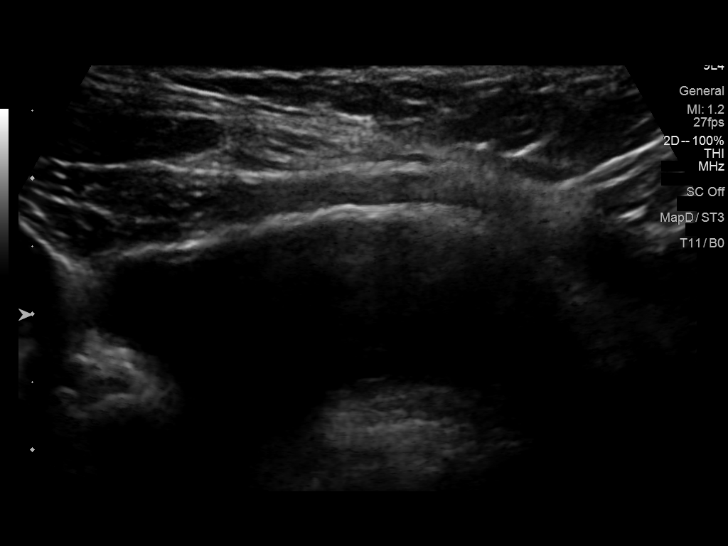
[im 34/37]
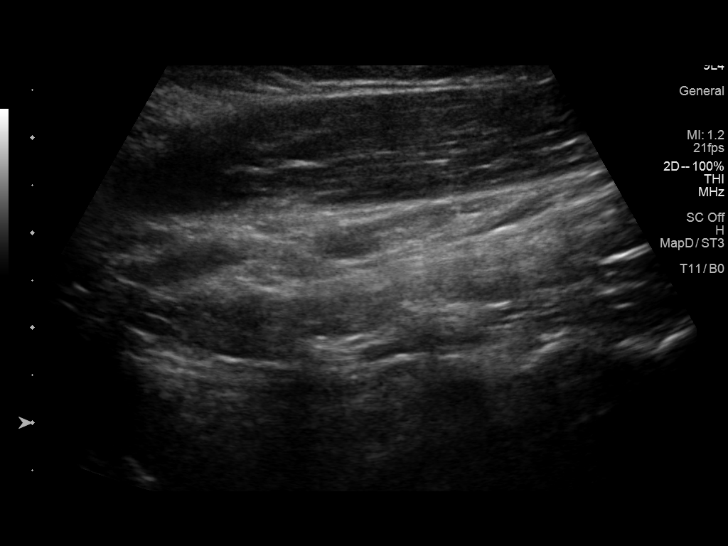
[im 37/37]
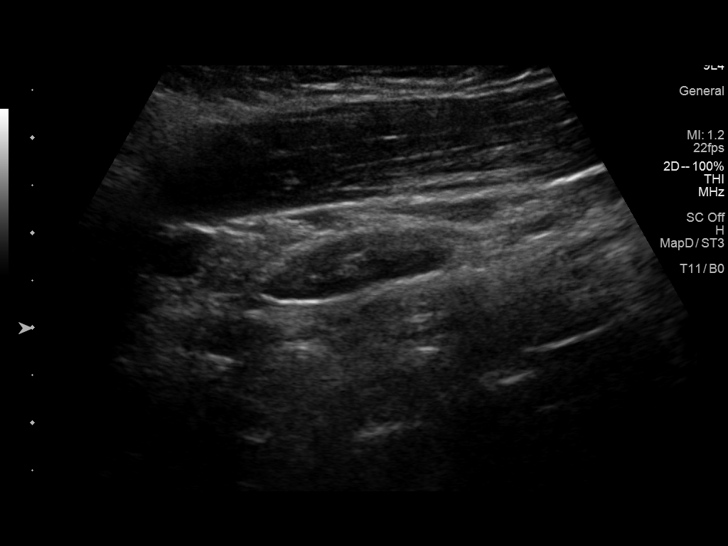

[13 of 25 positions shown; findings below may reference images not displayed]

FINDINGS: Parenchymal Echotexture: Normal

Isthmus: 0.4 cm

Right lobe: 5.6 x 1.5 x 1.4 cm

Left lobe: 4.8 x 1.0 x 1.9 cm

_________________________________________________________

Estimated total number of nodules >/= 1 cm: 2

Number of spongiform nodules >/=  2 cm not described below (TR1): 0

Number of mixed cystic and solid nodules >/= 1.5 cm not described
below (TR2): 0

_________________________________________________________

Nodule labeled 1 is a solid and cystic isoechoic nodule (TR 2)
measuring up to 1.0 cm in the mid right thyroid lobe. This nodule
does NOT meet TI-RADS criteria for biopsy or dedicated follow-up.

Nodule labeled 2 is a solid very hypoechoic nodule (TR 4) which
appears to be within the most inferior aspect of the right thyroid
lobe and measures 1.6 x 1.3 x 0.7 cm. **Given size (>/= 1.5 cm) and
appearance, fine needle aspiration of this moderately suspicious
nodule should be considered based on TI-RADS criteria.
IMPRESSION: 1. Borderline enlarged multinodular thyroid gland.
2. Nodule labeled 2 in the inferior right thyroid lobe meets
criteria for biopsy.

The above is in keeping with the ACR TI-RADS recommendations - [HOSPITAL] [UA];[DATE].

## 2021-07-21 ENCOUNTER — Telehealth: Payer: Self-pay | Admitting: Internal Medicine

## 2021-07-21 DIAGNOSIS — E042 Nontoxic multinodular goiter: Secondary | ICD-10-CM | POA: Insufficient documentation

## 2021-07-21 NOTE — Telephone Encounter (Signed)
Spoke to Ms. Janvrin 07/21/2021 at 40 Am and discussed thyroid ultrasound showing MNG with left inferior 1.6 cm nodule meeting FNA criteria   She has agreed to this   Discussed osteopenia on DXA, encouraged 2-3 servings of dietary calcium

## 2021-08-13 ENCOUNTER — Other Ambulatory Visit (HOSPITAL_COMMUNITY)
Admission: RE | Admit: 2021-08-13 | Discharge: 2021-08-13 | Disposition: A | Discharge status: Home / Self Care | Payer: Medicaid Other | Source: Ambulatory Visit | Attending: Internal Medicine | Admitting: Internal Medicine

## 2021-08-13 ENCOUNTER — Ambulatory Visit
Admission: RE | Admit: 2021-08-13 | Discharge: 2021-08-13 | Disposition: A | Discharge status: Home / Self Care | Payer: Medicaid Other | Source: Ambulatory Visit | Attending: Internal Medicine | Admitting: Internal Medicine

## 2021-08-13 DIAGNOSIS — E042 Nontoxic multinodular goiter: Secondary | ICD-10-CM | POA: Insufficient documentation

## 2021-08-14 LAB — CYTOLOGY - NON PAP

## 2021-08-17 ENCOUNTER — Emergency Department (HOSPITAL_BASED_OUTPATIENT_CLINIC_OR_DEPARTMENT_OTHER)
Admission: EM | Admit: 2021-08-17 | Discharge: 2021-08-18 | Disposition: A | Discharge status: Home / Self Care | Payer: Medicaid Other | Attending: Emergency Medicine | Admitting: Emergency Medicine

## 2021-08-17 ENCOUNTER — Emergency Department (HOSPITAL_COMMUNITY): Payer: Medicaid Other

## 2021-08-17 ENCOUNTER — Emergency Department (HOSPITAL_BASED_OUTPATIENT_CLINIC_OR_DEPARTMENT_OTHER): Payer: Medicaid Other | Admitting: Radiology

## 2021-08-17 ENCOUNTER — Other Ambulatory Visit: Payer: Self-pay

## 2021-08-17 ENCOUNTER — Encounter (HOSPITAL_BASED_OUTPATIENT_CLINIC_OR_DEPARTMENT_OTHER): Payer: Self-pay

## 2021-08-17 DIAGNOSIS — E119 Type 2 diabetes mellitus without complications: Secondary | ICD-10-CM | POA: Diagnosis not present

## 2021-08-17 DIAGNOSIS — R42 Dizziness and giddiness: Secondary | ICD-10-CM | POA: Diagnosis not present

## 2021-08-17 DIAGNOSIS — I10 Essential (primary) hypertension: Secondary | ICD-10-CM | POA: Insufficient documentation

## 2021-08-17 DIAGNOSIS — R3915 Urgency of urination: Secondary | ICD-10-CM

## 2021-08-17 DIAGNOSIS — R200 Anesthesia of skin: Secondary | ICD-10-CM | POA: Diagnosis not present

## 2021-08-17 DIAGNOSIS — R202 Paresthesia of skin: Secondary | ICD-10-CM

## 2021-08-17 DIAGNOSIS — R29898 Other symptoms and signs involving the musculoskeletal system: Secondary | ICD-10-CM

## 2021-08-17 DIAGNOSIS — Z79899 Other long term (current) drug therapy: Secondary | ICD-10-CM | POA: Diagnosis not present

## 2021-08-17 DIAGNOSIS — R531 Weakness: Secondary | ICD-10-CM | POA: Insufficient documentation

## 2021-08-17 DIAGNOSIS — Z7984 Long term (current) use of oral hypoglycemic drugs: Secondary | ICD-10-CM | POA: Insufficient documentation

## 2021-08-17 LAB — URINALYSIS, ROUTINE W REFLEX MICROSCOPIC
Bilirubin Urine: NEGATIVE
Glucose, UA: >1000 mg/dL — AB
Hgb urine dipstick: NEGATIVE
Ketones, ur: 40 mg/dL — AB
Leukocytes,Ua: NEGATIVE
Nitrite: NEGATIVE
Protein, ur: NEGATIVE mg/dL
Specific Gravity, Urine: 1.03 (ref 1.005–1.030)
pH: 7.5 (ref 5.0–8.0)

## 2021-08-17 LAB — BASIC METABOLIC PANEL
Anion gap: 10 (ref 5–15)
BUN: 13 mg/dL (ref 6–20)
CO2: 25 mmol/L (ref 22–32)
Calcium: 9.4 mg/dL (ref 8.9–10.3)
Chloride: 102 mmol/L (ref 98–111)
Creatinine, Ser: 1.06 mg/dL — ABNORMAL HIGH (ref 0.44–1.00)
GFR, Estimated: >60 mL/min (ref >60)
Glucose, Bld: 175 mg/dL — ABNORMAL HIGH (ref 70–99)
Potassium: 3.6 mmol/L (ref 3.5–5.1)
Sodium: 137 mmol/L (ref 135–145)

## 2021-08-17 LAB — TROPONIN I (HIGH SENSITIVITY): Troponin I (High Sensitivity): 4 ng/L

## 2021-08-17 LAB — CBC
HCT: 39.9 % (ref 36.0–46.0)
Hemoglobin: 13.1 g/dL (ref 12.0–15.0)
MCH: 27 pg (ref 26.0–34.0)
MCHC: 32.8 g/dL (ref 30.0–36.0)
MCV: 82.1 fL (ref 80.0–100.0)
Platelets: 411 K/uL — ABNORMAL HIGH (ref 150–400)
RBC: 4.86 MIL/uL (ref 3.87–5.11)
RDW: 12.7 % (ref 11.5–15.5)
WBC: 7.8 K/uL (ref 4.0–10.5)
nRBC: 0 % (ref 0.0–0.2)

## 2021-08-17 LAB — TSH: TSH: 0.766 u[IU]/mL (ref 0.350–4.500)

## 2021-08-17 LAB — MAGNESIUM: Magnesium: 2.1 mg/dL (ref 1.7–2.4)

## 2021-08-17 LAB — HCG, SERUM, QUALITATIVE: Preg, Serum: NEGATIVE

## 2021-08-17 IMAGING — MR MR CERVICAL SPINE WO/W CM
5 of 8 series · 18 of 48 positions shown · IV contrast (gadavist)
Comparison: None.

CLINICAL DATA: Acute neurologic deficit. Bilateral hand numbness
and weakness.

EXAM:
MRI HEAD WITHOUT AND WITH CONTRAST
MRI CERVICAL SPINE WITHOUT AND WITH CONTRAST
TECHNIQUE: Multiplanar, multiecho pulse sequences of the brain and surrounding
structures, and cervical spine, to include the craniocervical
junction and cervicothoracic junction, were obtained without and
with intravenous contrast.
CONTRAST:  9.5mL GADAVIST GADOBUTROL 1 MMOL/ML IV SOLN

[Series 10: T2 · sagittal · 3.0mm · 0.35mm/px · 2 of 17 slices shown (1 of 2)]
[im 1/17]
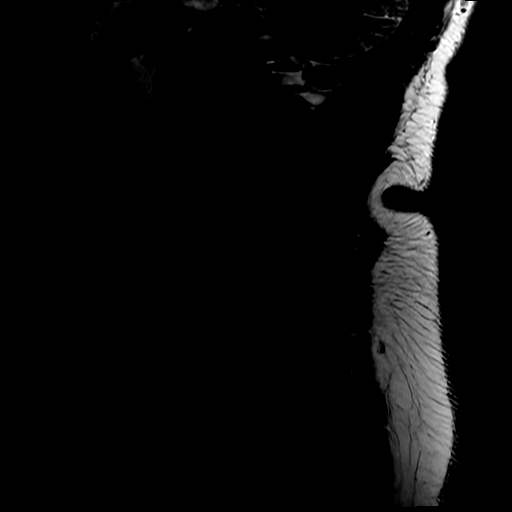
[im 17/17]
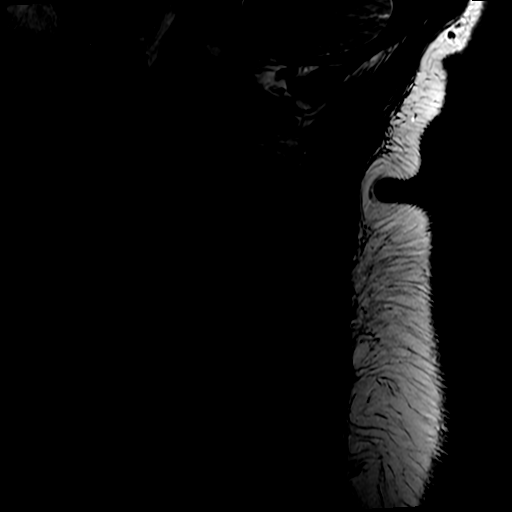

[Series 12: STIR · sagittal · 3.0mm · 0.35mm/px · 1 of 17 slices shown]
[im 1/17]
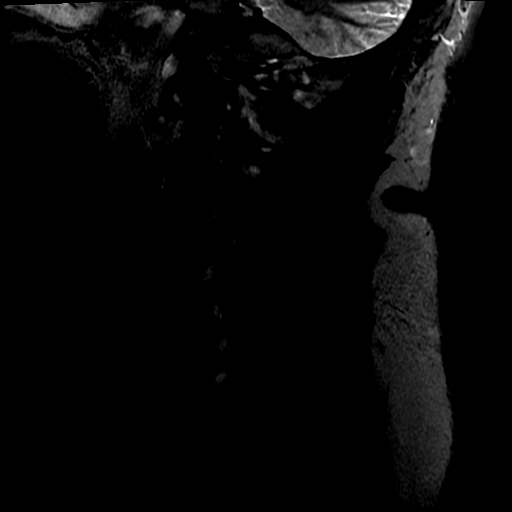

[Series 14: T2 · axial · 3.0mm · 0.35mm/px · z∈[-195,-76]mm · 6 of 38 slices shown (2 of 2)]
[im 1/38]
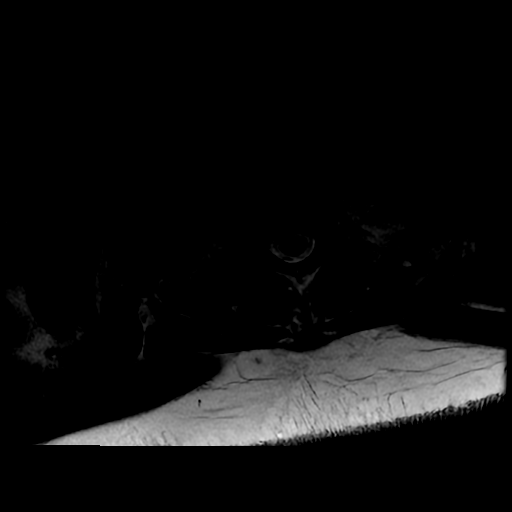
[im 8/38]
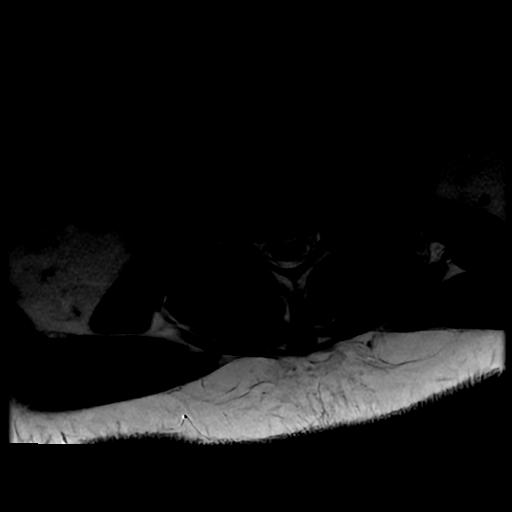
[im 15/38]
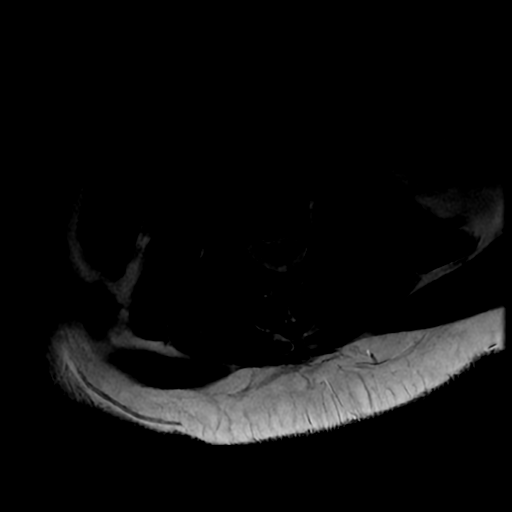
[im 23/38]
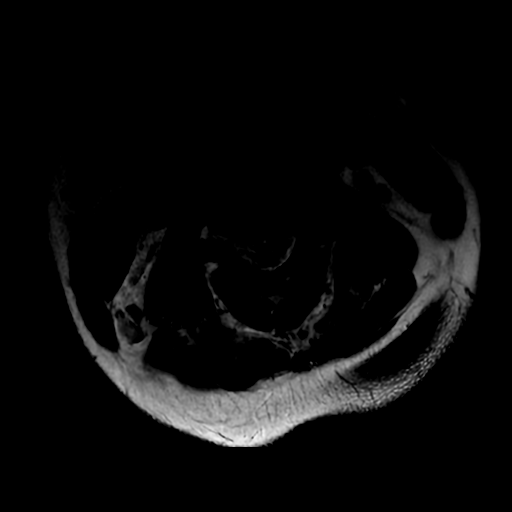
[im 30/38]
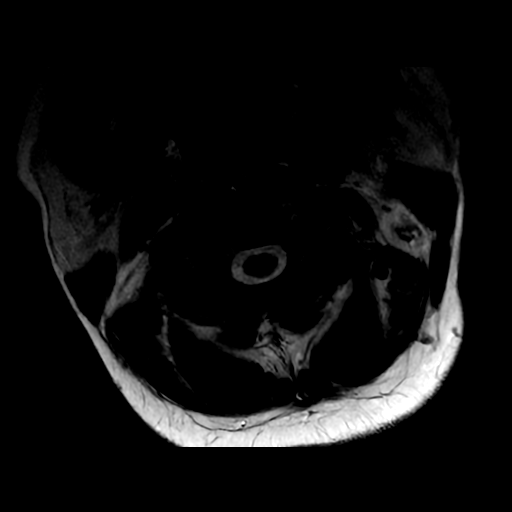
[im 38/38]
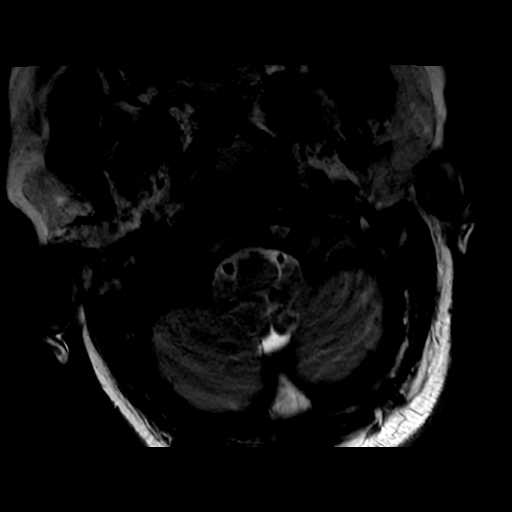

[Series 15: T1 · axial · non-contrast · 3.0mm · 0.35mm/px · z∈[-195,-76]mm · 6 of 38 slices shown]
[im 1/38]
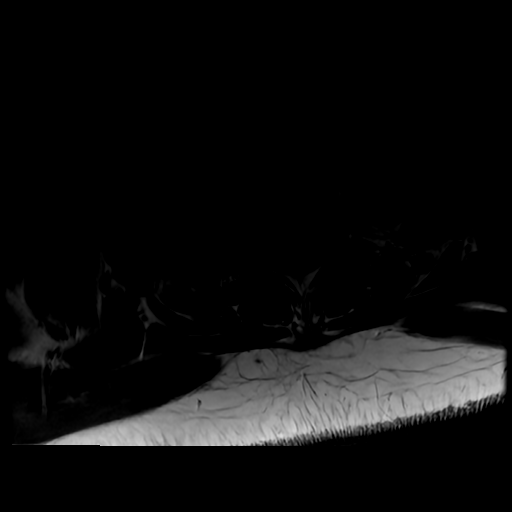
[im 8/38]
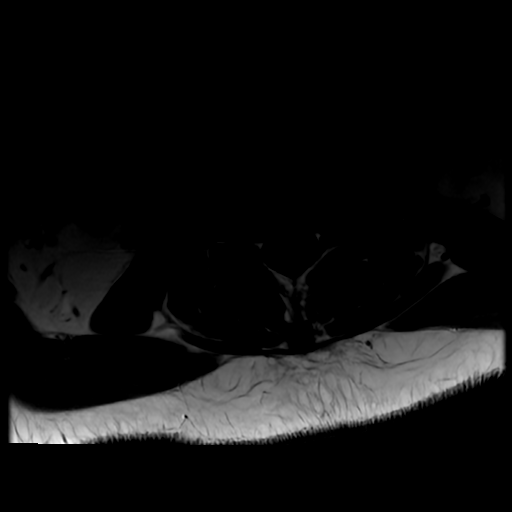
[im 15/38]
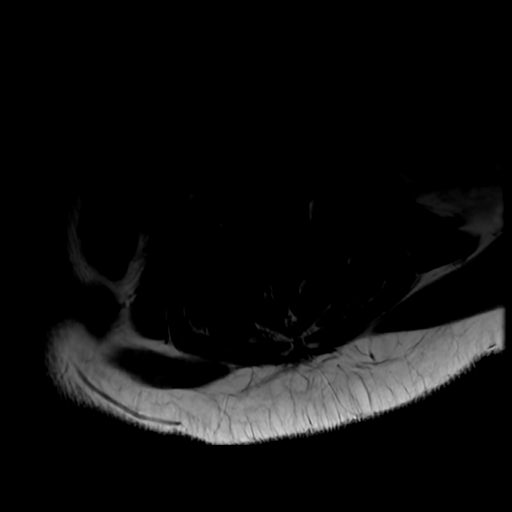
[im 23/38]
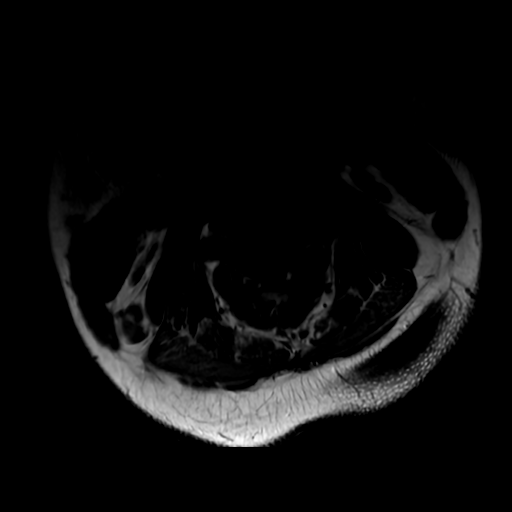
[im 30/38]
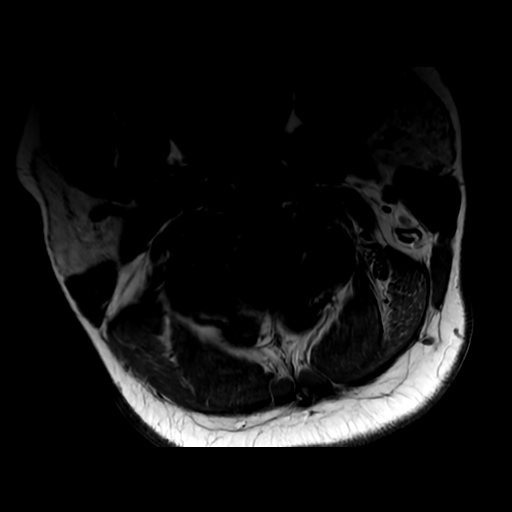
[im 38/38]
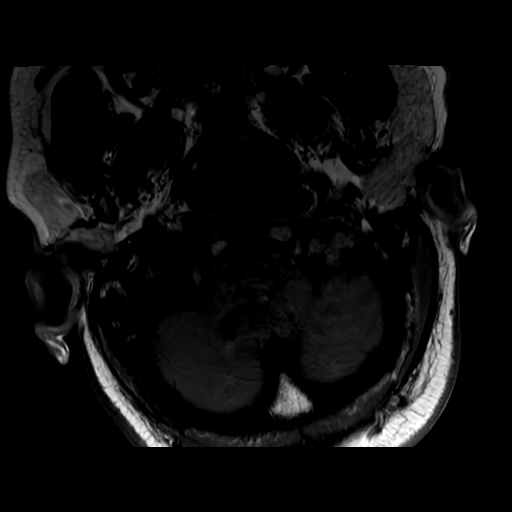

[Series 16: T1 fat-sat post-contrast · sagittal · 3.0mm · 0.35mm/px · 3 of 17 slices shown]
[im 1/17]
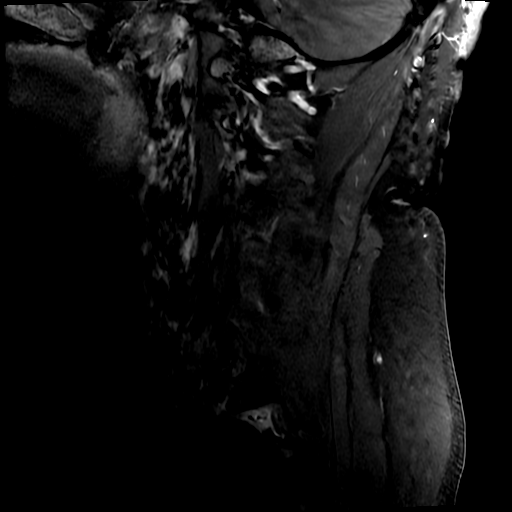
[im 9/17]
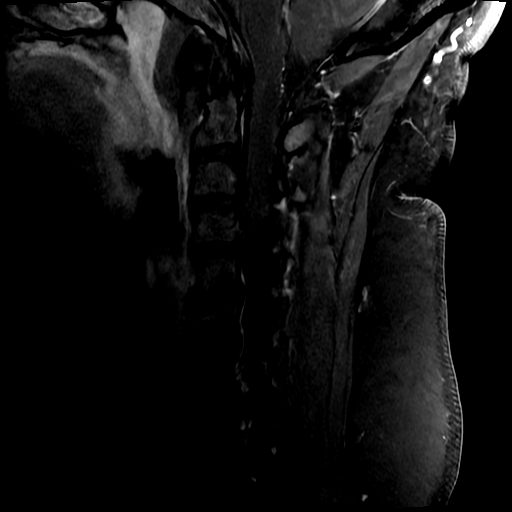
[im 17/17]
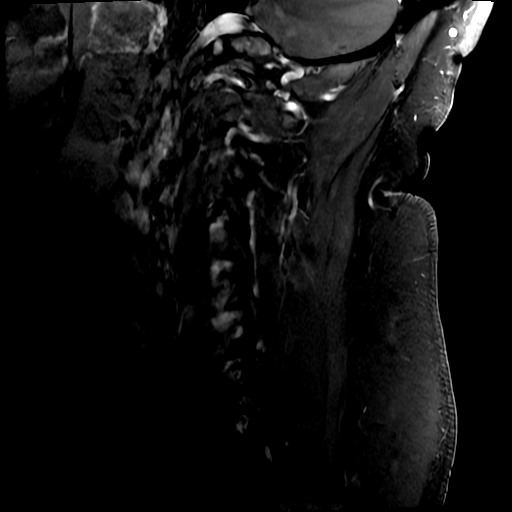

[18 of 48 positions shown; findings below may reference images not displayed]

FINDINGS: MRI HEAD FINDINGS

Brain: No acute infarct, mass effect or extra-axial collection. No
acute or chronic hemorrhage. Normal white matter signal, parenchymal
volume and CSF spaces. The midline structures are normal. There is
no abnormal contrast enhancement.

Vascular: Major flow voids are preserved.

Skull and upper cervical spine: Normal calvarium and skull base.
Visualized upper cervical spine and soft tissues are normal.

Sinuses/Orbits:No paranasal sinus fluid levels or advanced mucosal
thickening. No mastoid or middle ear effusion. Normal orbits.

MRI CERVICAL SPINE FINDINGS

Alignment: Physiologic.

Vertebrae: No fracture, evidence of discitis, or bone lesion.

Cord: Normal signal and morphology.  Abnormal contrast enhancement.

Posterior Fossa, vertebral arteries, paraspinal tissues: Negative.

Disc levels:

C1-2: Unremarkable.

C2-3: Normal disc space and facet joints. There is no spinal canal
stenosis. No neural foraminal stenosis.

C3-4: Normal disc space and facet joints. There is no spinal canal
stenosis. No neural foraminal stenosis.

C4-5: Normal disc space and facet joints. There is no spinal canal
stenosis. No neural foraminal stenosis.

C5-6: Right subarticular disc protrusion. There is no spinal canal
stenosis. Mild right neural foraminal stenosis.

C6-7: Normal disc space and facet joints. There is no spinal canal
stenosis. No neural foraminal stenosis.

C7-T1: Normal disc space and facet joints. There is no spinal canal
stenosis. No neural foraminal stenosis.
IMPRESSION: 1. Normal MRI of the brain.
2. Right subarticular disc protrusion at C5-6 with mild right neural
foraminal stenosis.
3. Otherwise normal MRI of the cervical spine.

## 2021-08-17 IMAGING — MR MR HEAD WO/W CM
6 of 12 series · 26 of 48 positions shown · IV contrast (9.5 M GAD)
Comparison: None.

CLINICAL DATA: Acute neurologic deficit. Bilateral hand numbness
and weakness.

EXAM:
MRI HEAD WITHOUT AND WITH CONTRAST
MRI CERVICAL SPINE WITHOUT AND WITH CONTRAST
TECHNIQUE: Multiplanar, multiecho pulse sequences of the brain and surrounding
structures, and cervical spine, to include the craniocervical
junction and cervicothoracic junction, were obtained without and
with intravenous contrast.
CONTRAST:  9.5mL GADAVIST GADOBUTROL 1 MMOL/ML IV SOLN

[Series 2: DWI · axial · 3.0mm · 0.94mm/px · z∈[-71,+79]mm · 8 of 104 slices shown (1 of 2)]
[im 1/104]
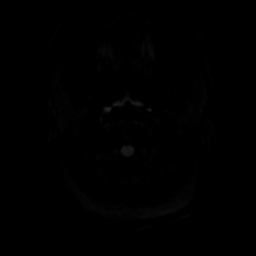
[im 15/104]
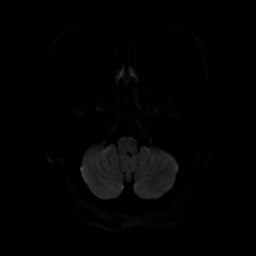
[im 30/104]
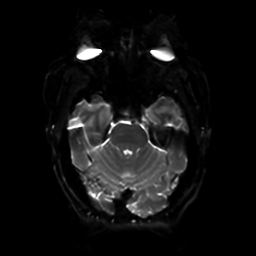
[im 45/104]
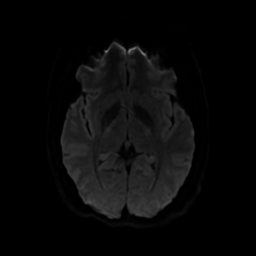
[im 59/104]
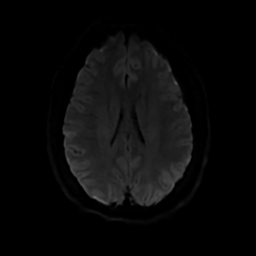
[im 74/104]
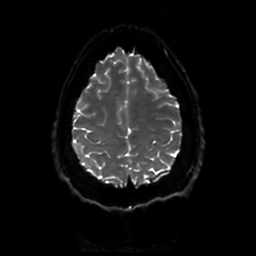
[im 89/104]
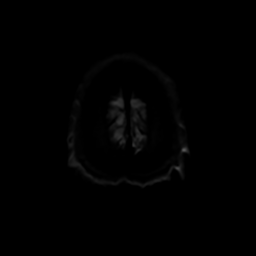
[im 104/104]
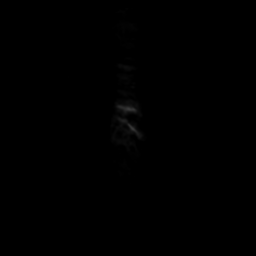

[Series 3: DWI · coronal · 4.0mm · 0.94mm/px · 6 of 78 slices shown (2 of 2)]
[im 1/78]
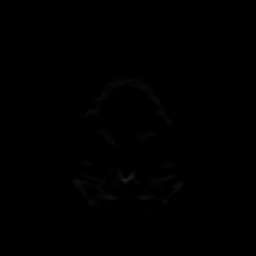
[im 16/78]
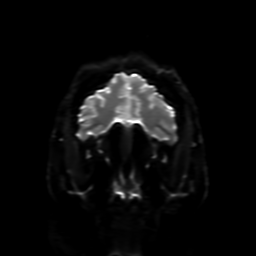
[im 31/78]
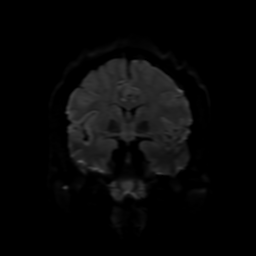
[im 47/78]
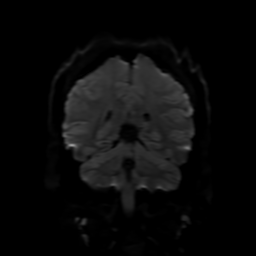
[im 62/78]
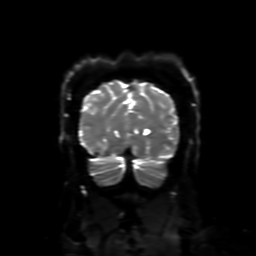
[im 78/78]
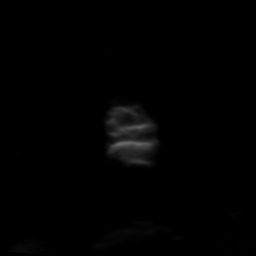

[Series 4: FLAIR · sagittal · 5.0mm · 0.23mm/px · 2 of 27 slices shown (1 of 2)]
[im 1/27]
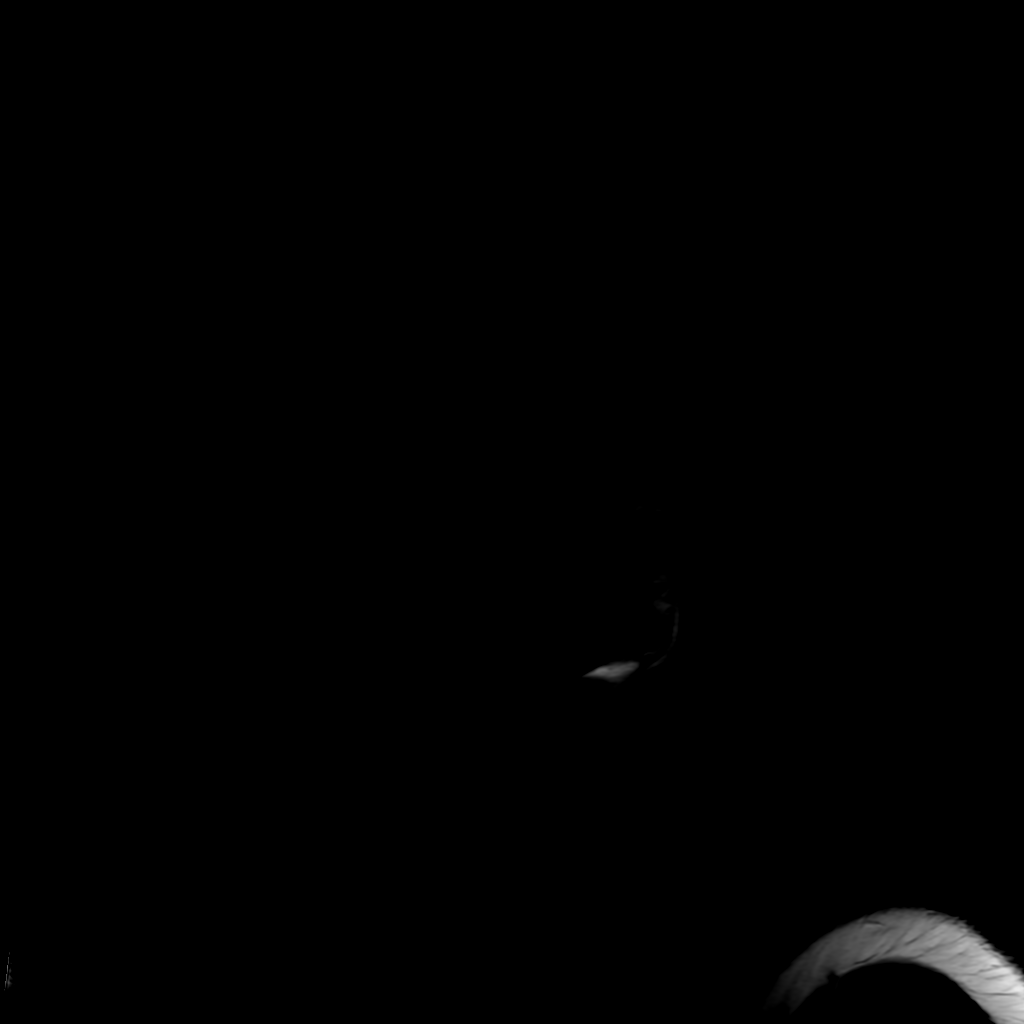
[im 27/27]
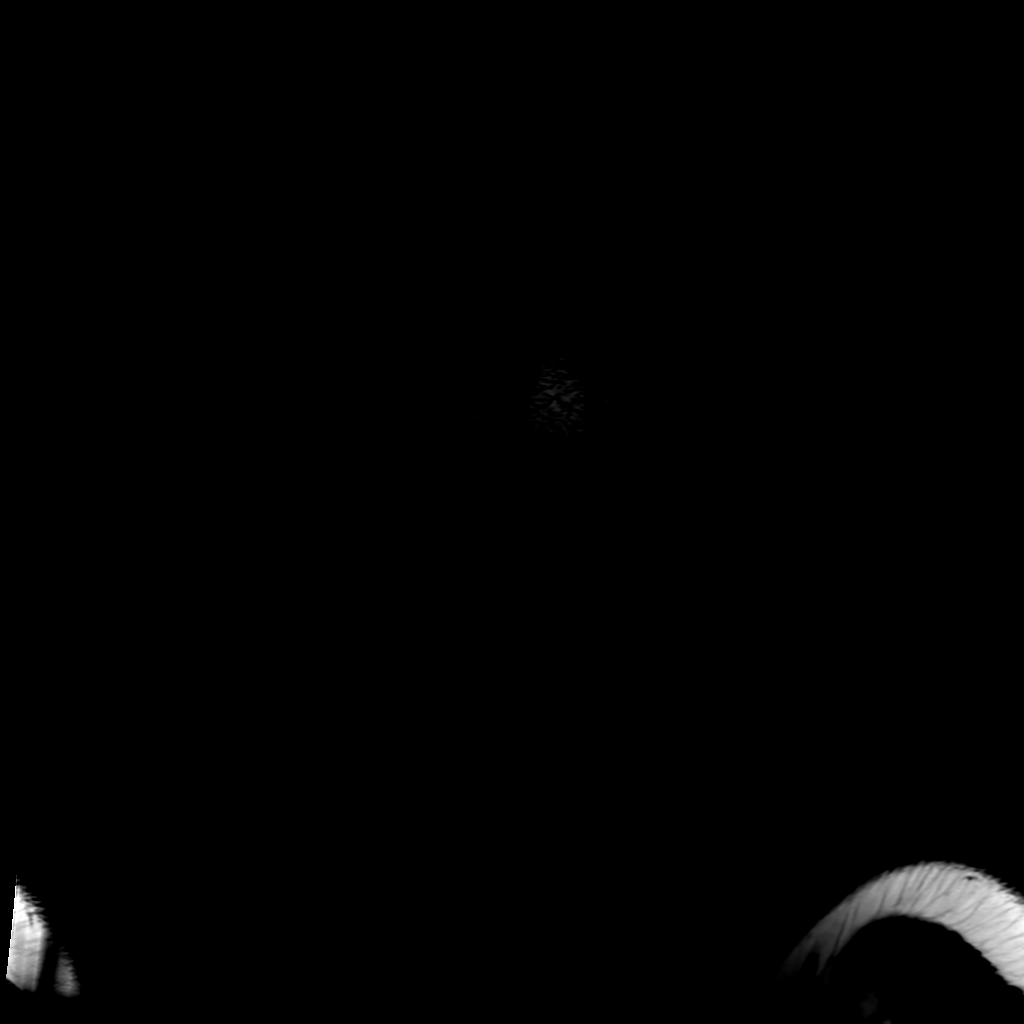

[Series 6: FLAIR · axial · 4.0mm · 0.45mm/px · z∈[-58,+91]mm · 3 of 35 slices shown (2 of 2)]
[im 1/35]
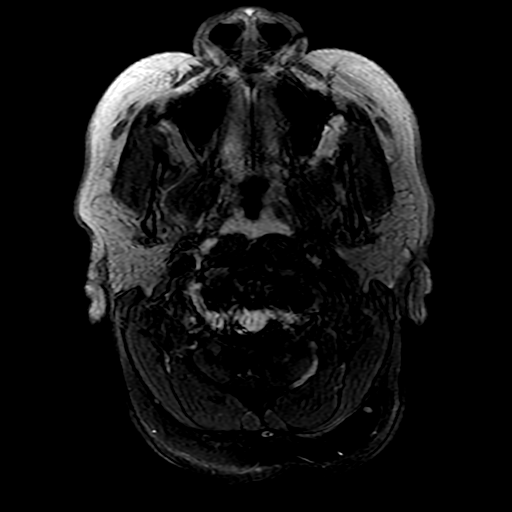
[im 18/35]
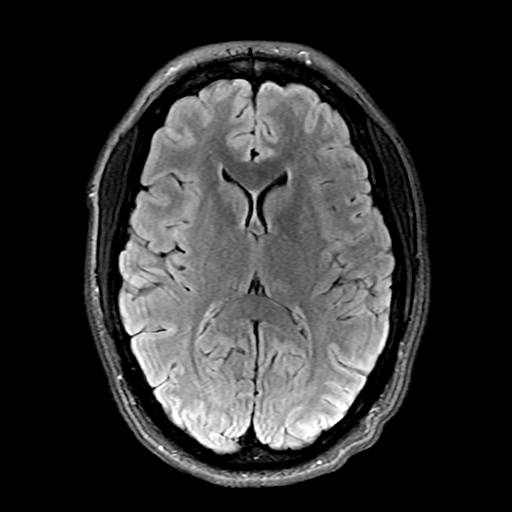
[im 35/35]
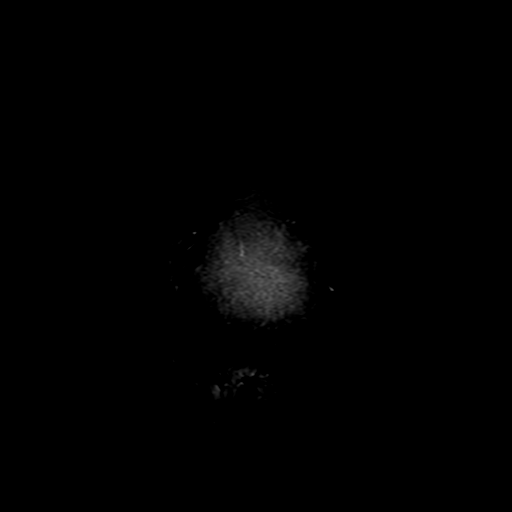

[Series 250: ADC · axial · 3.0mm · 0.94mm/px · z∈[-71,+79]mm · 4 of 52 slices shown (1 of 2)]
[im 1/52]
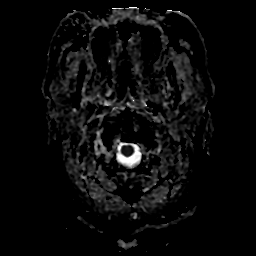
[im 18/52]
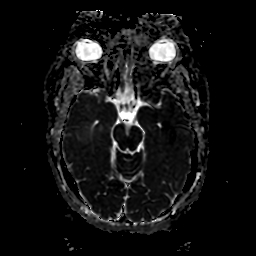
[im 35/52]
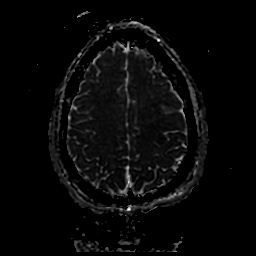
[im 52/52]
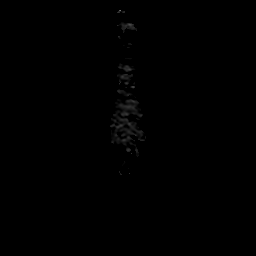

[Series 350: ADC · coronal · 4.0mm · 0.94mm/px · 3 of 38 slices shown (2 of 2)]
[im 1/38]
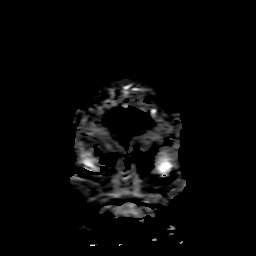
[im 19/38]
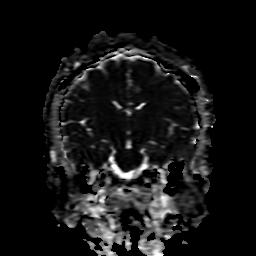
[im 38/38]
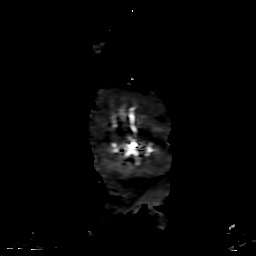

[26 of 48 positions shown; findings below may reference images not displayed]

FINDINGS: MRI HEAD FINDINGS

Brain: No acute infarct, mass effect or extra-axial collection. No
acute or chronic hemorrhage. Normal white matter signal, parenchymal
volume and CSF spaces. The midline structures are normal. There is
no abnormal contrast enhancement.

Vascular: Major flow voids are preserved.

Skull and upper cervical spine: Normal calvarium and skull base.
Visualized upper cervical spine and soft tissues are normal.

Sinuses/Orbits:No paranasal sinus fluid levels or advanced mucosal
thickening. No mastoid or middle ear effusion. Normal orbits.

MRI CERVICAL SPINE FINDINGS

Alignment: Physiologic.

Vertebrae: No fracture, evidence of discitis, or bone lesion.

Cord: Normal signal and morphology.  Abnormal contrast enhancement.

Posterior Fossa, vertebral arteries, paraspinal tissues: Negative.

Disc levels:

C1-2: Unremarkable.

C2-3: Normal disc space and facet joints. There is no spinal canal
stenosis. No neural foraminal stenosis.

C3-4: Normal disc space and facet joints. There is no spinal canal
stenosis. No neural foraminal stenosis.

C4-5: Normal disc space and facet joints. There is no spinal canal
stenosis. No neural foraminal stenosis.

C5-6: Right subarticular disc protrusion. There is no spinal canal
stenosis. Mild right neural foraminal stenosis.

C6-7: Normal disc space and facet joints. There is no spinal canal
stenosis. No neural foraminal stenosis.

C7-T1: Normal disc space and facet joints. There is no spinal canal
stenosis. No neural foraminal stenosis.
IMPRESSION: 1. Normal MRI of the brain.
2. Right subarticular disc protrusion at C5-6 with mild right neural
foraminal stenosis.
3. Otherwise normal MRI of the cervical spine.

## 2021-08-17 IMAGING — DX DG CHEST 2V
2 series · 2 of 2 positions shown · non-contrast
Comparison: [DATE]

CLINICAL DATA: Chest pain, upper extremity paresthesias

EXAM:
CHEST - 2 VIEW

[chest pa]
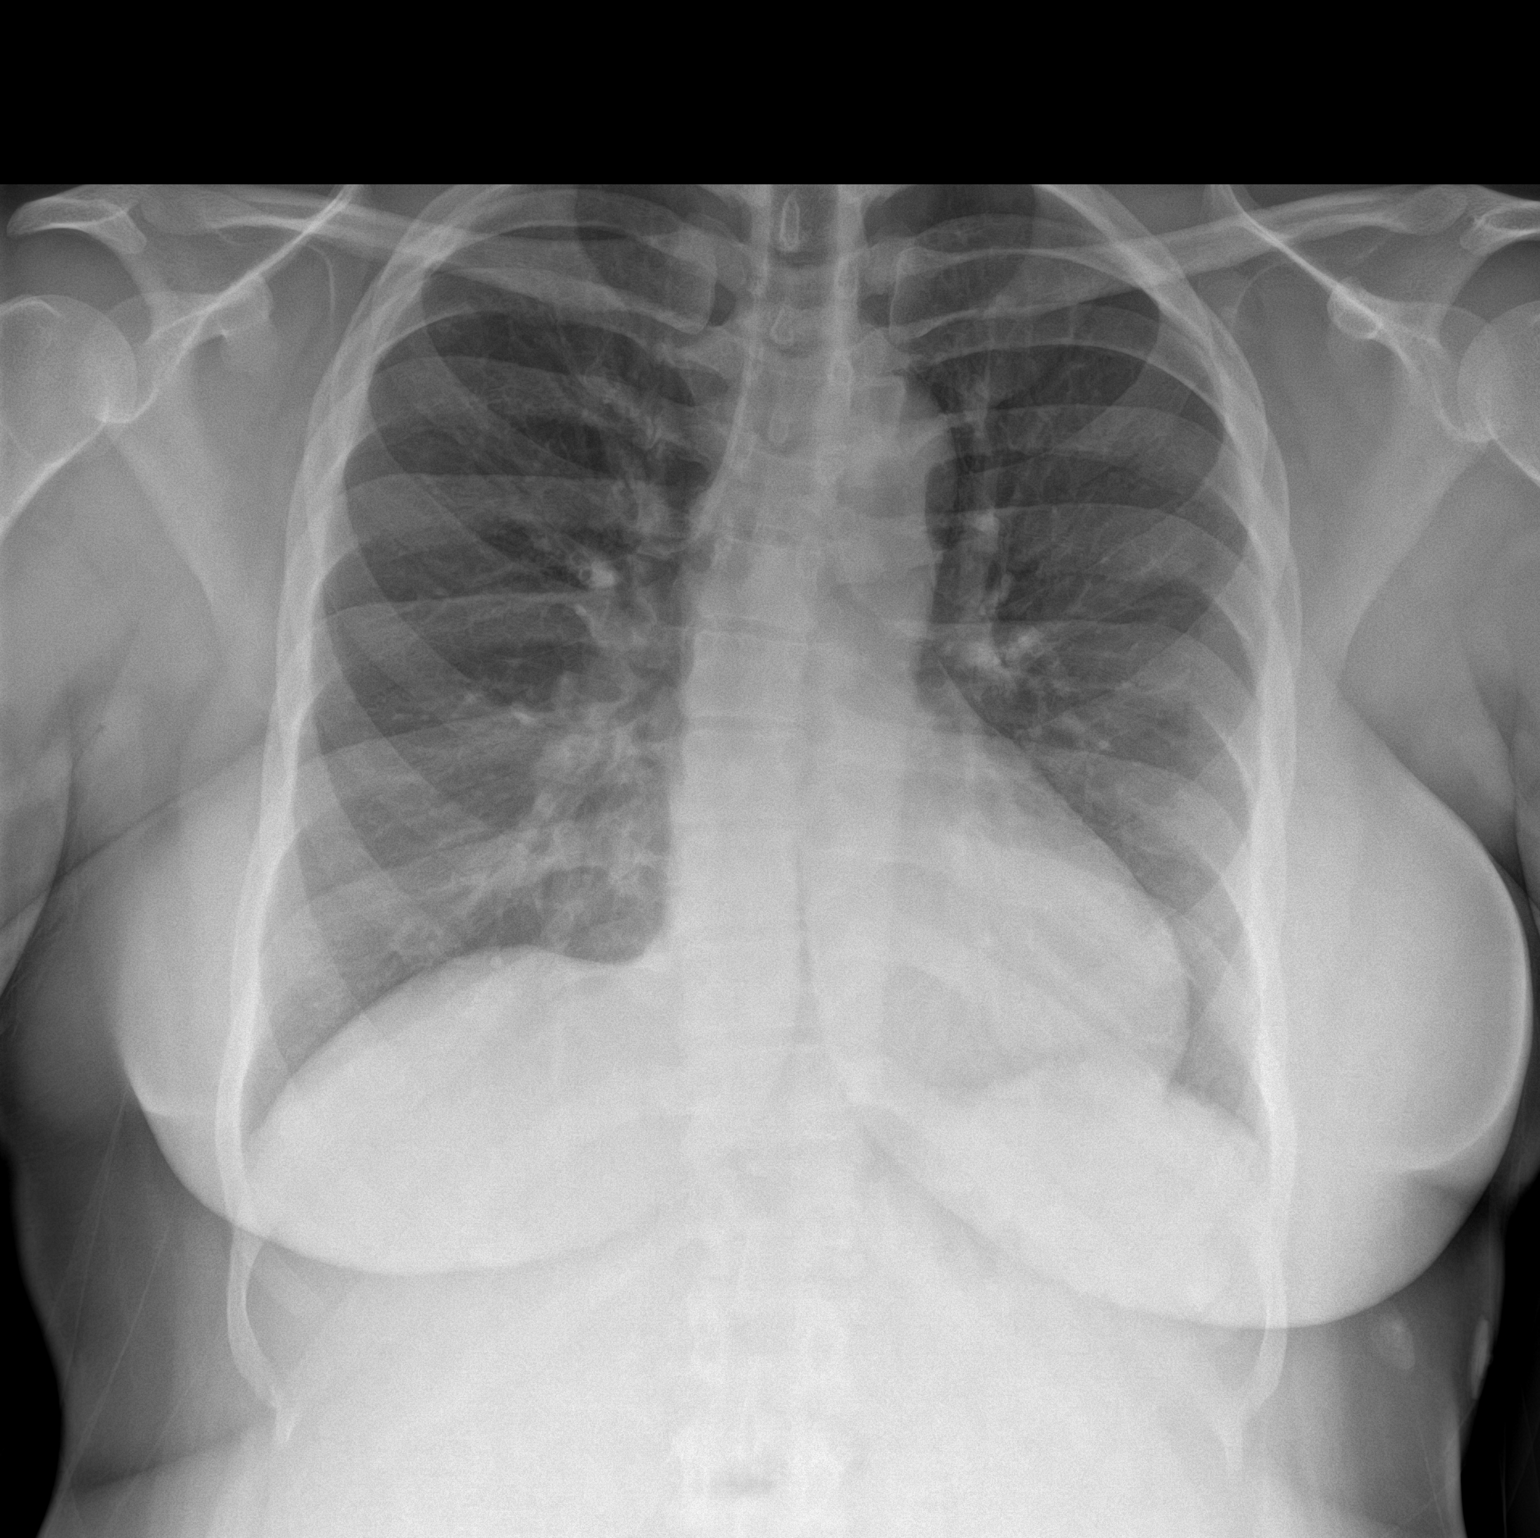

[chest lat]
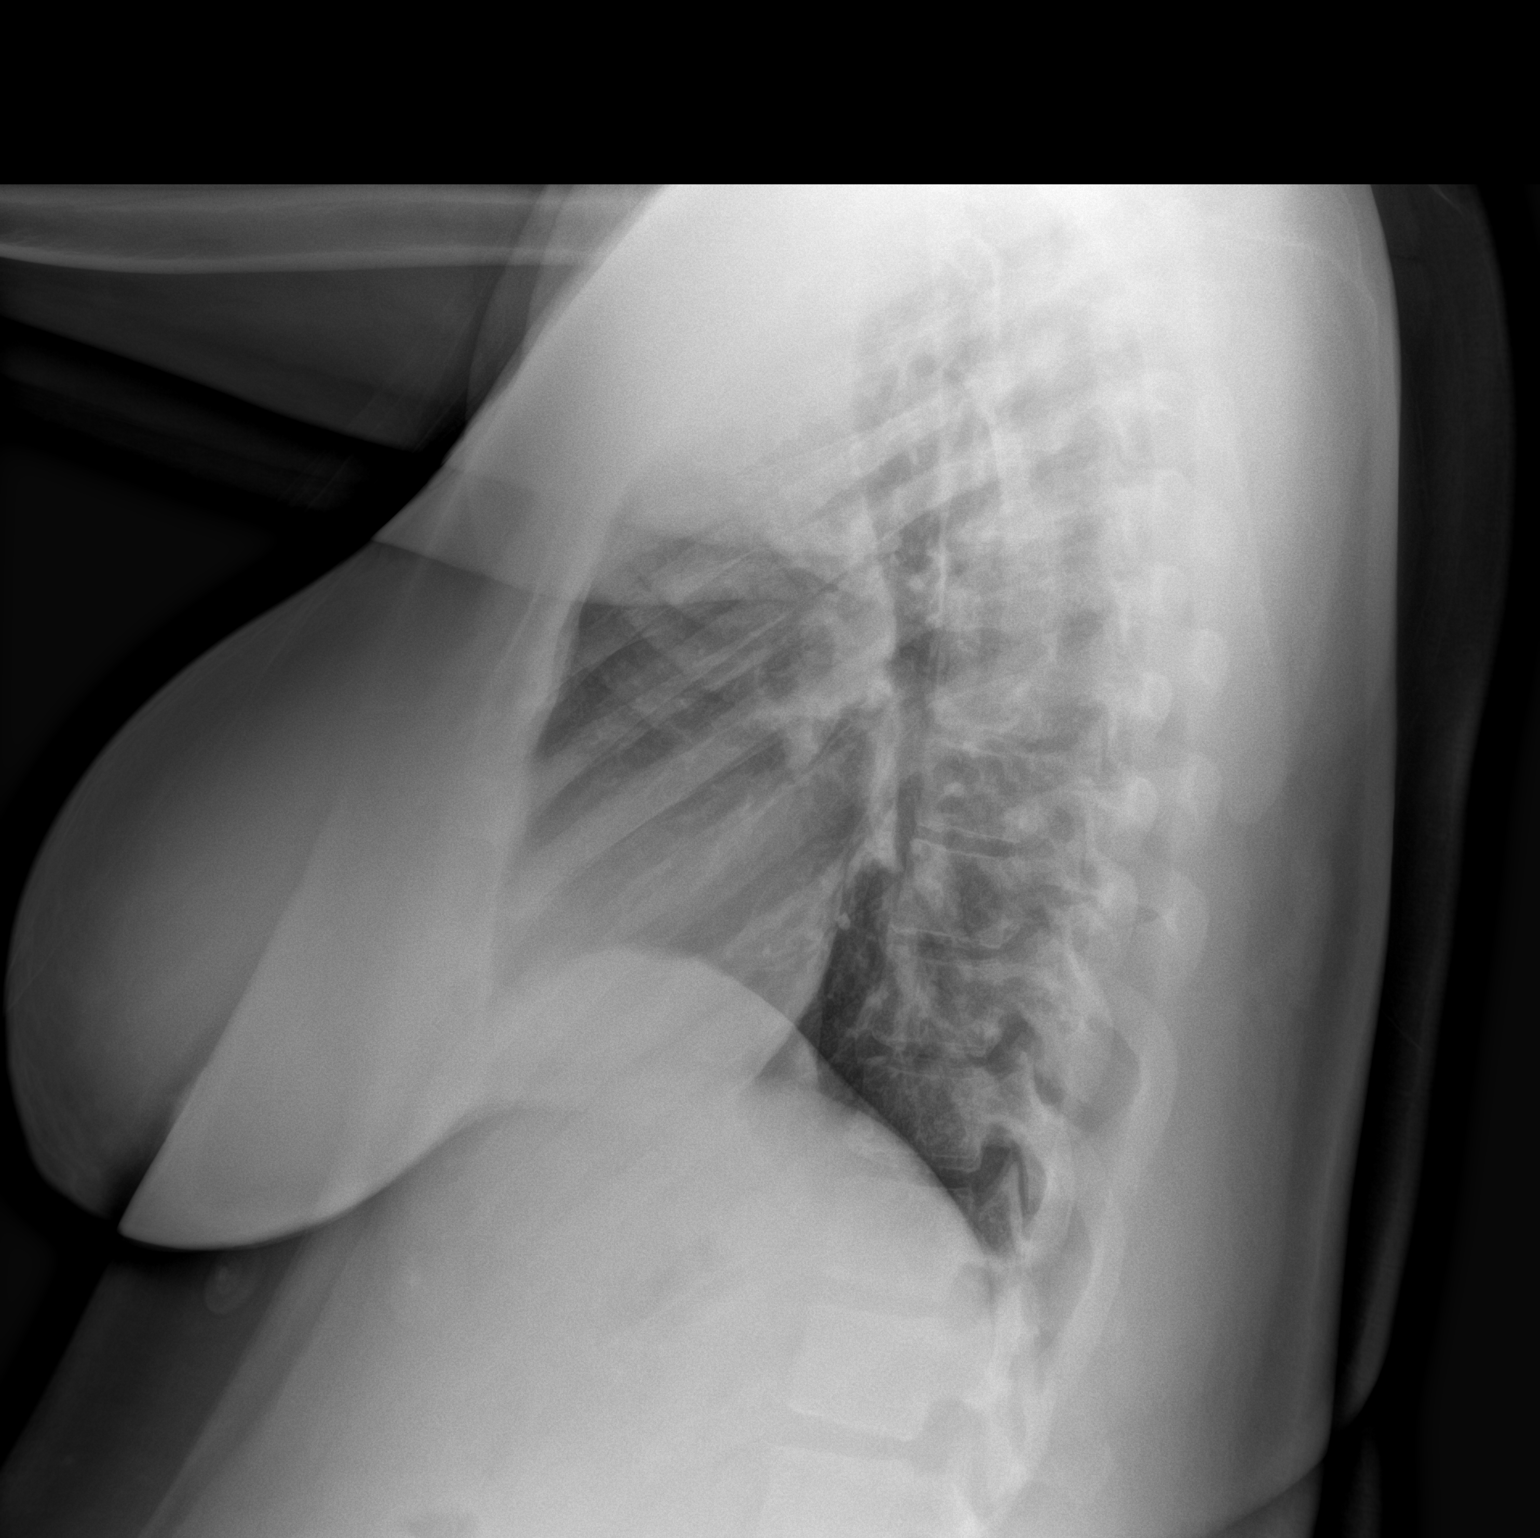

[2 of 2 positions shown; findings below may reference images not displayed]

FINDINGS: Normal heart size and vascularity. Minor streaky basilar
atelectasis. Negative for definite pneumonia, collapse or
consolidation. No edema, effusion or pneumothorax. Trachea midline.
Slight scoliosis of the spine as before.
IMPRESSION: Minor basilar atelectasis. No other acute process by plain
radiography.

## 2021-08-17 MED ORDER — GADOBUTROL 1 MMOL/ML IV SOLN
9.5000 mL | Freq: Once | INTRAVENOUS | Status: AC | PRN
  Administered 2021-08-17: 9.5 mL via INTRAVENOUS

## 2021-08-17 NOTE — ED Provider Triage Note (Signed)
Emergency Medicine Provider Triage Evaluation Note  Emma Brown , a 36 y.o. female  was evaluated in triage.  Pt complains of progressive ascending tingliness that started yesterday.  Patient does have a family history of multiple sclerosis.  Was seen over at [facility] and was sent to the emergency department here for MRI.  Review of Systems  Positive:  Negative: See above   Physical Exam  BP (!) 158/110 (BP Location: Left Arm)    Pulse 96    Temp 98 F (36.7 C) (Oral)    Resp 14    Ht 5' 7.5" (1.715 m)    Wt 97.5 kg    LMP 07/18/2021 (Approximate)    SpO2 98%    BMI 33.17 kg/m  Gen:   Awake, no distress   Resp:  Normal effort  MSK:   Moves extremities without difficulty  Other:    Medical Decision Making  Medically screening exam initiated at 8:50 PM.  Appropriate orders placed.  Emma Brown was informed that the remainder of the evaluation will be completed by another provider, this initial triage assessment does not replace that evaluation, and the importance of remaining in the ED until their evaluation is complete.     Emma Brown, [place] 08/17/21 2051

## 2021-08-17 NOTE — ED Triage Notes (Signed)
Patient here POV from Home with Tingling.  Patient endorses Tingling to since 0900 yesterday. Patient awoke with symptoms. States "Tingling" initially was to Bilateral Hands and has since radiated to Bilateral Arms and Face.  Also endorses Weakness, CP, SOB with Exertion, and Lightheadedness.   No Fevers. No N/V/D.   NAD Noted during Triage. A&Ox4. GCS 15. Ambulatory.

## 2021-08-17 NOTE — ED Notes (Signed)
Pt left POV with IV in place.

## 2021-08-17 NOTE — ED Notes (Signed)
Accepting : Emma Brown

## 2021-08-17 NOTE — ED Provider Notes (Signed)
[facility] Provider Note   CSN: [id number] Arrival date & time: 08/17/21  1521     History  Chief Complaint  Patient presents with   Tingling    Emma Brown is a 36 y.o. female.  The history is provided by the patient and medical records. No language interpreter was used.  Neurologic Problem This is a recurrent problem. The current episode started yesterday. The problem occurs constantly. The problem has been gradually worsening. Pertinent negatives include no chest pain, no abdominal pain, no headaches and no shortness of breath. Nothing aggravates the symptoms. Nothing relieves the symptoms. She has tried nothing for the symptoms. The treatment provided no relief.      Home Medications Prior to Admission medications   Medication Sig Start Date End Date Taking? Authorizing Provider  amLODipine (NORVASC) 10 MG tablet Take 10 mg by mouth daily.    [provider]  buPROPion (WELLBUTRIN XL) 150 MG 24 hr tablet Take 1 tablet by mouth daily. 08/22/20   [provider]  empagliflozin (JARDIANCE) 10 MG TABS tablet Take 1 tablet (10 mg total) by mouth daily before breakfast. 07/14/21   Shamleffer, Melanie Crazier, MD  LO LOESTRIN FE 1 MG-10 MCG / 10 MCG tablet Take 1 tablet by mouth daily. 10/23/20   Shelly Bombard, MD  metFORMIN (GLUCOPHAGE-XR) 500 MG 24 hr tablet Take 1 tablet (500 mg total) by mouth daily with breakfast. 07/14/21   Shamleffer, Melanie Crazier, MD  Prenat w/o A Vit-FeFum-FePo-FA (CONCEPT OB) 130-92.4-1 MG CAPS Take 1 capsule by mouth daily before breakfast. Patient not taking: Reported on 07/13/2021 10/23/20   Shelly Bombard, MD  valsartan (DIOVAN) 160 MG tablet Take 160 mg by mouth daily. Patient not taking: Reported on 07/13/2021    [provider]  Vitamin D, Ergocalciferol, (DRISDOL) 1.25 MG (50000 UNIT) CAPS capsule Take 1 capsule (50,000 Units total) by mouth every 7 (seven) days. 07/14/21   Shamleffer,  Melanie Crazier, MD      Allergies    Pineapple and Shellfish allergy    Review of Systems   Review of Systems  Constitutional:  Positive for fatigue. Negative for chills and fever.  HENT:  Negative for congestion.   Eyes:  Positive for visual disturbance.  Respiratory:  Negative for cough, chest tightness and shortness of breath.   Cardiovascular:  Negative for chest pain, palpitations and leg swelling.  Gastrointestinal:  Negative for abdominal pain, constipation, diarrhea, nausea and vomiting.  Genitourinary:  Negative for dysuria and flank pain.  Musculoskeletal:  Negative for back pain, neck pain and neck stiffness.  Skin:  Negative for rash and wound.  Neurological:  Positive for dizziness, weakness and numbness. Negative for seizures, light-headedness and headaches.  Psychiatric/Behavioral:  Negative for agitation and confusion.   All other systems reviewed and are negative.  Physical Exam Updated Vital Signs BP (!) 144/99    Pulse 92    Temp 98.3 F (36.8 C)    Resp 18    Ht 5' 7.5" (1.715 m)    Wt 97.5 kg    LMP 07/18/2021 (Approximate)    SpO2 99%    BMI 33.17 kg/m  Physical Exam Vitals and nursing note reviewed.  Constitutional:      General: She is not in acute distress.    Appearance: She is well-developed. She is not ill-appearing, toxic-appearing or diaphoretic.  HENT:     Head: Normocephalic and atraumatic.     Nose: No congestion or rhinorrhea.  Mouth/Throat:     Mouth: Mucous membranes are moist.     Pharynx: No oropharyngeal exudate or posterior oropharyngeal erythema.  Eyes:     Extraocular Movements: Extraocular movements intact.     Conjunctiva/sclera: Conjunctivae normal.     Pupils: Pupils are equal, round, and reactive to light.  Cardiovascular:     Rate and Rhythm: Normal rate and regular rhythm.     Heart sounds: No murmur heard. Pulmonary:     Effort: Pulmonary effort is normal. No respiratory distress.     Breath sounds: Normal breath  sounds. No wheezing, rhonchi or rales.  Chest:     Chest wall: No tenderness.  Abdominal:     General: Abdomen is flat.     Palpations: Abdomen is soft.     Tenderness: There is no abdominal tenderness. There is no right CVA tenderness, left CVA tenderness, guarding or rebound.  Musculoskeletal:        General: No swelling or tenderness.     Cervical back: Neck supple. No tenderness.  Skin:    General: Skin is warm and dry.     Capillary Refill: Capillary refill takes less than 2 seconds.     Findings: No erythema.  Neurological:     Mental Status: She is alert.     Cranial Nerves: No cranial nerve deficit.     Sensory: Sensory deficit present.     Motor: Weakness present.     Coordination: Coordination normal.  Psychiatric:        Mood and Affect: Mood normal.    ED Results / Procedures / Treatments   Labs (all labs ordered are listed, but only abnormal results are displayed) Labs Reviewed  BASIC METABOLIC PANEL - Abnormal; Notable for the following components:      Result Value   Glucose, Bld 175 (*)    Creatinine, Ser 1.06 (*)    All other components within normal limits  CBC - Abnormal; Notable for the following components:   Platelets 411 (*)    All other components within normal limits  URINALYSIS, ROUTINE W REFLEX MICROSCOPIC - Abnormal; Notable for the following components:   Glucose, UA >1,000 (*)    Ketones, ur 40 (*)    All other components within normal limits  URINE CULTURE  HCG, SERUM, QUALITATIVE  TSH  MAGNESIUM  TROPONIN I (HIGH SENSITIVITY)  TROPONIN I (HIGH SENSITIVITY)    EKG EKG Interpretation  Date/Time:  Monday August 17 2021 15:42:28 EST Ventricular Rate:  100 PR Interval:  168 QRS Duration: 80 QT Interval:  344 QTC Calculation: 443 R Axis:   47 Text Interpretation: Normal sinus rhythm Possible Left atrial enlargement Nonspecific T wave abnormality Abnormal ECG No previous ECGs available no prior ECG for comparison. no STEMI  Confirmed by Antony Blackbird [phone]) on 08/17/2021 4:39:24 PM  Radiology DG Chest 2 View  Result Date: 08/17/2021 CLINICAL DATA:  Chest pain, upper extremity paresthesias EXAM: CHEST - 2 VIEW COMPARISON:  10/30/2012 FINDINGS: Normal heart size and vascularity. Minor streaky basilar atelectasis. Negative for definite pneumonia, collapse or consolidation. No edema, effusion or pneumothorax. Trachea midline. Slight scoliosis of the spine as before. IMPRESSION: Minor basilar atelectasis. No other acute process by plain radiography. Electronically Signed   By: Jerilynn Mages.  Shick M.D.   On: 08/17/2021 16:47    Procedures Procedures    Medications Ordered in ED Medications  gadobutrol (GADAVIST) 1 MMOL/ML injection 9.5 mL (9.5 mLs Intravenous Contrast Given 08/17/21 2238)  gadobutrol (GADAVIST) 1 MMOL/ML injection  9.5 mL (9.5 mLs Intravenous Contrast Given 08/17/21 2240)    ED Course/ Medical Decision Making/ A&P                           Medical Decision Making Amount and/or Complexity of Data Reviewed Labs: ordered. Radiology: ordered.  Risk Prescription drug management.    Emma Brown is a 36 y.o. female with a past medical history significant for diabetes, hypertension, PCOS, known multinodular goiter, and fibroids who presents with numbness, tingling, weakness, urinary urgency, and transient dizziness/blurry vision.  According to patient, since yesterday morning, patient has had persistent and progressive numbness, tingling, and weakness in extremities.  She reports that it started in her left hand with some numbness and then progressed to both hands and then subsequently started moving proximally with numbness and weakness in both arms now compared to baseline.  She also reports some numbness in her right face compared to left but does not have any facial weakness or facial droop reported.  She denies difficulty with speech but does report some occasional blurry vision.  She says that she is not  feeling dizzy currently but had several episodes of dizziness over the last month.  About 1 month ago, she had an episode of some tingling in arms followed by unsteadiness and dizziness and blurry vision that lasted up to an hour or 2.  She reports that it happened again a week and a half ago lasting for several hours.  She reports that she has never had persistent symptoms in her arms like this and she is concerned.  She is currently denying any chest pain or palpitations.  She denies any fevers, chills congestion, cough.  She denies any headache or neck pain.  She denies any constipation or diarrhea but does report she had some urgency and his had some near incontinent episodes over the last week.  She denies any symptoms in her legs at this time.  Of note, patient reports she has a strong history of MS and several family members with cousins and one of them was diagnosed at age 44.  She denies any new neck trauma or any neck injuries previously.  On exam, lungs are clear.  Chest is nontender.  Abdomen is nontender.  Patient had normal bowel sounds.  No numbness or weakness in the legs on exam.  Patient had subjective numbness and weakness in both arms that is symmetric.  She had intact pulses.  Hoffmann signs negative in both hands.  Neck was nontender and I cannot reproduce symptoms.  Her right face was more numb compared to left side.  No facial droop.  Pupils are symmetric and reactive normal extraocular movements.  Patient subjectively thought she had some slight blurry vision compared to baseline bilaterally.  Normal finger-nose-finger testing bilaterally.  Clear speech.  Exam otherwise unremarkable.  Clinically I am somewhat concerned about possible MS versus TIA and stroke.  I spoke to Dr. Quinn Axe with the teleneurology team who recommends transfer to [facility], ED to ED for MRI with and without contrast of the brain and C-spine.  We will also get screening labs and basic work-up  otherwise.  Patient will be transferred for MRIs and further work-up.  6:45 PM Just spoke to Dr. Vanita Panda in the emergency department and is going to accept the patient for ED to ED transfer.  Plan of care be to get the MRI head and neck as well as wait for the  labs to return and then reassess.  If concerning findings are discovered, patient will need neurology consultation from the emergency department at [facility].        Final Clinical Impression(s) / ED Diagnoses Final diagnoses:  Bilateral arm weakness  Left arm numbness  Right arm numbness  Numbness and tingling of right face  Dizzy spells  Urinary urgency    Clinical Impression: 1. Bilateral arm weakness   2. Left arm numbness   3. Right arm numbness   4. Numbness and tingling of right face   5. Dizzy spells   6. Urinary urgency     Disposition: ED to ED transfer to [facility] for MRI and reassessment.  This note was prepared with assistance of Systems analyst. Occasional wrong-word or sound-a-like substitutions may have occurred due to the inherent limitations of voice recognition software.      Kelly Eisler, Gwenyth Allegra, MD 08/17/21 [phone]

## 2021-08-18 NOTE — ED Notes (Signed)
Dc instructions reviewed with pt. PT verbalized understanding. PT DC 

## 2021-08-18 NOTE — ED Provider Notes (Signed)
Received patient as a transfer for MRI.  Patient with bilateral upper extremity numbness and weakness.  MRI with and without contrast of brain and cervical spine performed.  No demyelination, no stroke.  There is disc protrusion at C5-6 with mild right neural foraminal stenosis, this does not explain her bilateral symptoms.  Patient indicates that she has had severe grief and anxiety recently.  She becomes tearful when discussing this.  I did discuss with her hyperventilation syndrome and the possibility of panic attack causing some of her symptoms.  As her symptoms are not completely explained, however, will refer for outpatient neurology.   Orpah Greek, MD 08/18/21 [phone]

## 2021-08-19 LAB — URINE CULTURE

## 2021-08-25 ENCOUNTER — Telehealth: Payer: Self-pay | Admitting: Internal Medicine

## 2021-08-25 ENCOUNTER — Encounter (HOSPITAL_COMMUNITY): Payer: Self-pay

## 2021-08-25 NOTE — Telephone Encounter (Signed)
?  Spoke to Ms. 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 ? ? ? ?We did discuss the option to proceed with repeat FNA in 3 months versus right lobectomy ? ?Given that the patient is a single and would like to avoid any chance of injuring her vocal cord, well order FNA on her next office visit which is scheduled for the end of May 2023 ? ? ? ? ?Mack Guise, MD ? ?[facility]  ?[facility] ?[address] ?[place], [place] [address] ?Phone: [phone] ?FAX: [phone] ? ?

## 2021-10-13 ENCOUNTER — Ambulatory Visit: Payer: Medicaid Other | Admitting: Neurology

## 2021-10-13 ENCOUNTER — Encounter: Payer: Self-pay | Admitting: Neurology

## 2021-10-13 VITALS — BP 146/96 | HR 90 | Ht 67.5 in | Wt 211.5 lb

## 2021-10-13 DIAGNOSIS — R202 Paresthesia of skin: Secondary | ICD-10-CM | POA: Insufficient documentation

## 2021-10-13 NOTE — Progress Notes (Signed)
? ?Chief Complaint  ?Patient presents with  ? New Patient (Initial Visit)  ?  Rm 14. Alone. ?NP internal referral for bilateral arm weakness and numbness. ?States bilateral arms, face, neck and mouth became numb and had a pins and needles sensation, this was accompanied by dizziness. She states while working nights she has become dizzy, disoriented, and lightheaded.  ? ? ? ? ?ASSESSMENT AND PLAN ? ?Emma Brown is a 36 y.o. female  ?Intermittent paresthesia ? Essentially normal neurological examination, ? Nonrevealing MRI of the brain, cervical spine ? This happened in the setting of excessive stress, poor sleep quality, ? Differentiation diagnoses include anxiety related, versus with superimposed carpal tunnel syndrome ? Advised patient wear wrist splint, tighter control for diabetes, as needed NSAIDs ?  ? ? ?DIAGNOSTIC DATA (LABS, IMAGING, TESTING) ?- I reviewed patient records, labs, notes, testing and imaging myself where available. ? ? ?MEDICAL HISTORY: ? ?Marlowe Aschoff, seen in request by   Orpah Greek, MD ?[place],  [place] [id number], Lucianne Lei, MD  ? ?I reviewed and summarized the referring note. PMHX. ?Hyperparathyroidism, with elevated PTH, ?Hypertension ?Diabetes, most recent A1c was 8.0, ?Depression ?Polycystic ovarian disease ? 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  She complains of lack of sleep, excessive fatigue, also went through very stressful 2020, she lost her mother in September, ? ?On August 14, 2021, she woke up following a high stress day, she felt left hand numbness, over the next couple days, it began to involving left arm, and also right hand, right arm, she felt neck tightness, bilateral shoulder tightness, ? ?She presented to emergency room on July 29, 2021, ?I personally reviewed MRI of  the brain with without contrast, no significant abnormality ?MRI of cervical spine, mild multilevel degenerative changes, no significant canal stenosis, variable degree of foraminal narrowing, no evidence of nerve root compression ? ? ?Over the past few months, she still has intermittent hands numbness tingling, but there is no limitation in her daily activity, she denies visual change, ? ?PHYSICAL EXAM: ?  ?Vitals:  ? 10/13/21 1409 10/13/21 1410  ?BP: (!) 158/107 (!) 146/96  ?Pulse: 87 90  ?Weight: 211 lb 8 oz (95.9 kg)   ?Height: 5' 7.5" (1.715 m)   ? ?Not recorded ?  ? ? ?Body mass index is 32.64 kg/m?. ? ?PHYSICAL EXAMNIATION: ? ?Gen: NAD, conversant, well nourised, well groomed                     ?Cardiovascular: Regular rate rhythm, no peripheral edema, warm, nontender. ?Eyes: Conjunctivae clear without exudates or hemorrhage ?Neck: Supple, no carotid bruits. ?Pulmonary: Clear to auscultation bilaterally  ? ?NEUROLOGICAL EXAM: ? ?MENTAL STATUS: ?Speech/cognition: ?Awake, alert, oriented to history taking and casual conversation ?  ?CRANIAL NERVES: ?CN II: Visual fields are full to confrontation. Pupils are round equal and briskly reactive to light. ?CN III, IV, VI: extraocular movement are normal. No ptosis. ?CN V: Facial sensation is intact to light touch ?CN VII: Face is symmetric with normal eye closure  ?CN VIII: Hearing is normal to causal conversation. ?CN IX, X: Phonation is normal. ?CN XI: Head turning and shoulder shrug are intact ? ?MOTOR: ?There is no pronator drift of out-stretched arms. Muscle bulk and tone are normal. Muscle  strength is normal.  Bilateral wrist Tinel signs ? ?REFLEXES: ?Reflexes are 2+ and symmetric at the biceps, triceps, knees, and ankles. Plantar responses are flexor. ? ?SENSORY: ?Intact to light touch, pinprick and vibratory sensation are intact in fingers and toes. ? ?COORDINATION: ?There is no trunk or limb dysmetria noted. ? ?GAIT/STANCE: ?Posture is normal. Gait is  steady with normal steps, base, arm swing, and turning. Heel and toe walking are normal. Tandem gait is normal.  ?Romberg is absent. ? ?REVIEW OF SYSTEMS:  ?Full 14 system review of systems performed and notable only for as above ?All other review of systems were negative. ? ? ?ALLERGIES: ?Allergies  ?Allergen Reactions  ? Pineapple Swelling  ? Shellfish Allergy Shortness Of Breath  ? ? ?HOME MEDICATIONS: ?Current Outpatient Medications  ?Medication Sig Dispense Refill  ? amLODipine (NORVASC) 10 MG tablet Take 10 mg by mouth daily.    ? buPROPion (WELLBUTRIN XL) 150 MG 24 hr tablet Take 1 tablet by mouth daily.    ? empagliflozin (JARDIANCE) 10 MG TABS tablet Take 1 tablet (10 mg total) by mouth daily before breakfast. 90 tablet 1  ? metFORMIN (GLUCOPHAGE-XR) 500 MG 24 hr tablet Take 1 tablet (500 mg total) by mouth daily with breakfast. 90 tablet 1  ? Prenat w/o A Vit-FeFum-FePo-FA (CONCEPT OB) 130-92.4-1 MG CAPS Take 1 capsule by mouth daily before breakfast. 90 capsule 3  ? valsartan (DIOVAN) 160 MG tablet Take 160 mg by mouth daily.    ? ?No current facility-administered medications for this visit.  ? ? ?PAST MEDICAL HISTORY: ?Past Medical History:  ?Diagnosis Date  ? Arthritis   ? Depression   ? Diabetes mellitus without complication ([place])   ? Dyspnea   ? Fibroid   ? Gestational diabetes   ? Hypertension   ? Polycystic ovarian disease   ? Pregnancy induced hypertension   ? Preterm labor   ? Vaginal Pap smear, abnormal   ? ? ?PAST SURGICAL HISTORY: ?Past Surgical History:  ?Procedure Laterality Date  ? CESAREAN SECTION    ? OVARIAN CYST DRAINAGE    ? OVARIAN CYST DRAINAGE    ? WISDOM TOOTH EXTRACTION    ? ? ?FAMILY HISTORY: ?Family History  ?Problem Relation Age of Onset  ? Cancer Mother   ? Hypertension Mother   ? Hypertension Brother   ? Hypertension Maternal Aunt   ? Hypertension Maternal Uncle   ? Hypertension Maternal Grandmother   ? Cancer Maternal Grandfather   ? ? ?SOCIAL HISTORY: ?Social History   ? ?Socioeconomic History  ? Marital status: Married  ?  Spouse name: Not on file  ? Number of children: 3  ? Years of education: Not on file  ? Highest education level: Not on file  ?Occupational History  ? Occupation: [employer]  ?Tobacco Use  ? Smoking status: Never  ? Smokeless tobacco: Never  ?Vaping Use  ? Vaping Use: Never used  ?Substance and Sexual Activity  ? Alcohol use: No  ? Drug use: No  ? Sexual activity: Yes  ?  Birth control/protection: None  ?Other Topics Concern  ? Not on file  ?Social History Narrative  ? Not on file  ? ?Social Determinants of Health  ? ?Financial Resource Strain: Not on file  ?Food Insecurity: Not on file  ?Transportation Needs: Not on file  ?Physical Activity: Not on file  ?Stress: Not on file  ?Social Connections: Not on file  ?Intimate Partner Violence: Not on file  ? ? ? ? ?  Marcial Pacas, M.D. Ph.D. ? ?[facility] ?[address], [address] ?[place], [place] [address] ?Ph: [phone] ?Fax: [phone] ? ?CC:  Orpah Greek, MD ?[place],  [place] [address]  Lucianne Lei, MD   ?

## 2021-10-30 ENCOUNTER — Ambulatory Visit: Payer: Medicaid Other | Admitting: Internal Medicine

## 2021-10-30 ENCOUNTER — Encounter: Payer: Self-pay | Admitting: Internal Medicine

## 2021-10-30 VITALS — BP 130/80 | HR 88 | Ht 67.5 in | Wt 219.0 lb

## 2021-10-30 DIAGNOSIS — E042 Nontoxic multinodular goiter: Secondary | ICD-10-CM

## 2021-10-30 DIAGNOSIS — R739 Hyperglycemia, unspecified: Secondary | ICD-10-CM

## 2021-10-30 DIAGNOSIS — E1165 Type 2 diabetes mellitus with hyperglycemia: Secondary | ICD-10-CM | POA: Diagnosis not present

## 2021-10-30 LAB — POCT GLUCOSE (DEVICE FOR HOME USE): Glucose Fasting, POC: 253 mg/dL — AB (ref 70–99)

## 2021-10-30 LAB — BASIC METABOLIC PANEL
BUN: 14 mg/dL (ref 6–23)
CO2: 27 mEq/L (ref 19–32)
Calcium: 10.7 mg/dL — ABNORMAL HIGH (ref 8.4–10.5)
Chloride: 102 mEq/L (ref 96–112)
Creatinine, Ser: 0.77 mg/dL (ref 0.40–1.20)
GFR: 98.83 mL/min (ref >60.00)
Glucose, Bld: 226 mg/dL — ABNORMAL HIGH (ref 70–99)
Potassium: 3.9 mEq/L (ref 3.5–5.1)
Sodium: 136 mEq/L (ref 135–145)

## 2021-10-30 LAB — POCT GLYCOSYLATED HEMOGLOBIN (HGB A1C): Hemoglobin A1C: 9.4 % — AB (ref 4.0–5.6)

## 2021-10-30 LAB — ALBUMIN: Albumin: 4.3 g/dL (ref 3.5–5.2)

## 2021-10-30 LAB — VITAMIN D 25 HYDROXY (VIT D DEFICIENCY, FRACTURES): VITD: 15.73 ng/mL — ABNORMAL LOW (ref 30.00–100.00)

## 2021-10-30 LAB — T4, FREE: Free T4: 0.95 ng/dL (ref 0.60–1.60)

## 2021-10-30 LAB — TSH: TSH: 0.98 u[IU]/mL (ref 0.35–5.50)

## 2021-10-30 MED ORDER — GLIPIZIDE 5 MG PO TABS: 5.0000 mg | ORAL_TABLET | Freq: Two times a day (BID) | ORAL | 3 refills | Status: DC

## 2021-10-30 MED ORDER — ACCU-CHEK GUIDE VI STRP: 1.0000 | ORAL_STRIP | Freq: Every day | 3 refills | Status: AC

## 2021-10-30 NOTE — Progress Notes (Signed)
?Name: Emma Brown  ?Age/ Sex: 36 y.o., female   ?MRN/ DOB: [id number], 07-21-1985    ? ?PCP: Lucianne Lei, MD   ?Reason for Endocrinology Evaluation: Hypercalcemia   ?Initial Endocrine Consultative Visit: 11/03/2020  ? ? ?PATIENT IDENTIFIER: Emma Brown is a 36 y.o. female with a past medical history of T2DM, and HTN. The patient has followed with Endocrinology clinic since 11/03/2020 for consultative assistance with management of her diabetes. ? ?DIABETIC HISTORY:  ?Ms. Furnari was diagnosed with DM in 2017,  Was on insulin during pregnancy 2020 . Her hemoglobin A1c has ranged from 7.9% in 2020, peaking at 10.7% in 12/2019. ? ? ?On her initial clinic to our office she had an A1c of 8% , she was on Ozempic . She was advised to AVOID conception until her A1c at <7.0 %  . She was also advised to stop Ozempic prior to conception . Metformin was added as it was on her list but she was not taking it.  ? ?Her PCP increase her Ozempic which caused GI side effects, this was subsequently switched to [place].  Patient was not taking Jardiance and was complaining of nausea with Trulicity ? ?Patient asked me to manage her diabetes 06/2021, as in the past she has indicated her preference to manage through PCP ? ?By her return 10/2021 she was complaining of recurrent yeast infection and was not taking metformin due to GI side effect with 1 tablet  ? ? ?HYPERCALCEMIA HISTORY: ?  ?Ms. Yagi indicates that she was first diagnosed with hypercalcemia during routine labs with a serum calcium of 11.5 mg/dL in 06/2019. She was on HCTZ at the time  ? ?No hx of renal stones, or osteoporosis nor fractures ? ?On her initial visit we stopped HCTZ ? ?DXA low bone density 06/2021 ? ? ? ?THYROID HISTORY: ?She was diagnosed with MNG on thyroid ultrasound 06/2021. She is S/P FNA of the left inferior 1.6 cm nodule with atypia of undetermined significance ( Bethesda catergory III), were unable to proceed with Afirma due to  insufficient sample  ? ?SUBJECTIVE:  ? ? ? ?Today (10/30/2021): Emma Brown is here for a follow up on hyperparathyroidism, DM and MNG.   ? ?She ran out test strips and has not been checking glucose  ?She ran out of ergocalciferol a month ago and has not taken it in 4 weeks  ?She has not neen taking metformin due to GI upset with 1 tablet  ?Vania Rea is giving her recurrent yeast infection  ? ? ?We have not been able to proceed with 24- hr urinary excretion of calcium due to low vitamin D  ? ?She consumes 2-3 servings of calcium daily ? ? ?This morning she had hash brown only with a BG 253  mg/dL  ? ? ? ?HOME DIABETES REGIMEN:  ?Jardiance 10 mg daily ?Metformin 500 mg XR daily  ?Vitamin D 1000 iu daily  ? ?METER DOWNLOAD SUMMARY:  ? ? ?DIABETIC COMPLICATIONS: ?Microvascular complications:  ? ?Denies: CKD ?Last Eye Exam: Completed  ? ?Macrovascular complications:  ? ?Denies: CAD, CVA, PVD ? ? ? ? ? ?HISTORY:  ?Past Medical History:  ?Past Medical History:  ?Diagnosis Date  ? Arthritis   ? Depression   ? Diabetes mellitus without complication ([place])   ? Dyspnea   ? Fibroid   ? Gestational diabetes   ? Hypertension   ? Polycystic ovarian disease   ? Pregnancy induced hypertension   ? Preterm labor   ?  Vaginal Pap smear, abnormal   ? ?Past Surgical History:  ?Past Surgical History:  ?Procedure Laterality Date  ? CESAREAN SECTION    ? OVARIAN CYST DRAINAGE    ? OVARIAN CYST DRAINAGE    ? WISDOM TOOTH EXTRACTION    ? ?Social History:  reports that she has never smoked. She has never used smokeless tobacco. She reports that she does not drink alcohol and does not use drugs. ?Family History:  ?Family History  ?Problem Relation Age of Onset  ? Cancer Mother   ? Hypertension Mother   ? Hypertension Brother   ? Hypertension Maternal Aunt   ? Hypertension Maternal Uncle   ? Hypertension Maternal Grandmother   ? Cancer Maternal Grandfather   ? ? ? ?HOME MEDICATIONS: ?Allergies as of 10/30/2021   ? ?   Reactions  ? Pineapple Swelling   ? Shellfish Allergy Shortness Of Breath  ? ?  ? ?  ?Medication List  ?  ? ?  ? Accurate as of Oct 30, 2021  7:32 AM. If you have any questions, ask your nurse or doctor.  ?  ?  ? ?  ? ?amLODipine 10 MG tablet ?Commonly known as: NORVASC ?Take 10 mg by mouth daily. ?  ?buPROPion 150 MG 24 hr tablet ?Commonly known as: WELLBUTRIN XL ?Take 1 tablet by mouth daily. ?  ?Concept OB 130-92.4-1 MG Caps ?Take 1 capsule by mouth daily before breakfast. ?  ?empagliflozin 10 MG Tabs tablet ?Commonly known as: Jardiance ?Take 1 tablet (10 mg total) by mouth daily before breakfast. ?  ?metFORMIN 500 MG 24 hr tablet ?Commonly known as: GLUCOPHAGE-XR ?Take 1 tablet (500 mg total) by mouth daily with breakfast. ?  ?valsartan 160 MG tablet ?Commonly known as: DIOVAN ?Take 160 mg by mouth daily. ?  ? ?  ? ? ? ?OBJECTIVE:  ? ?Vital Signs:BP 130/80 (BP Location: Left Arm, Patient Position: Sitting, Cuff Size: Large)   Pulse 88   Ht 5' 7.5" (1.715 m)   Wt 219 lb (99.3 kg)   SpO2 96%   BMI 33.79 kg/m?  ? ? ?Wt Readings from Last 3 Encounters:  ?10/13/21 211 lb 8 oz (95.9 kg)  ?08/17/21 214 lb 15.2 oz (97.5 kg)  ?07/13/21 215 lb (97.5 kg)  ? ? ? ?Exam: ?General: Pt appears well and is in NAD  ?Neck: General: Supple without adenopathy. ?Thyroid: Thyromegaly noted   ?Lungs: Clear with good BS bilat with no rales, rhonchi, or wheezes  ?Heart: RRR, + systolic murmur  ?Abdomen: Normoactive bowel sounds, soft, nontender, without masses or organomegaly palpable  ?Extremities: No pretibial edema.   ?Neuro: MS is good with appropriate affect, pt is alert and Ox3  ? ? ? ? ?DATA REVIEWED: ? ?Lab Results  ?Component Value Date  ? HGBA1C 8.0 (H) 07/13/2021  ? HGBA1C 7.7 (A) 02/06/2021  ? HGBA1C 8.0 (A) 11/03/2020  ? ? ? ? Latest Reference Range & Units 10/30/21 10:45  ?Sodium 135 - 145 mEq/L 136  ?Potassium 3.5 - 5.1 mEq/L 3.9  ?Chloride 96 - 112 mEq/L 102  ?CO2 19 - 32 mEq/L 27  ?Glucose 70 - 99 mg/dL 226 (H)  ?BUN 6 - 23 mg/dL 14  ?Creatinine  0.40 - 1.20 mg/dL 0.77  ?Calcium 8.4 - 10.5 mg/dL 10.7 (H)  ?Albumin 3.5 - 5.2 g/dL 4.3  ?GFR >60.00 mL/min 98.83  ?VITD 30.00 - 100.00 ng/mL 15.73 (L)  ? ? Latest Reference Range & Units 10/30/21 10:45  ?TSH 0.35 - 5.50  uIU/mL 0.98  ?T4,Free(Direct) 0.60 - 1.60 ng/dL 0.95  ? ? ? ? ?Thyroid ultrasound 07/20/2021 ?Parenchymal Echotexture: Normal ?  ?Isthmus: 0.4 cm ?  ?Right lobe: 5.6 x 1.5 x 1.4 cm ?  ?Left lobe: 4.8 x 1.0 x 1.9 cm ?  ?_________________________________________________________ ?  ?Estimated total number of nodules >/= 1 cm: 2 ?  ?Number of spongiform nodules >/=  2 cm not described below (TR1): 0 ?  ?Number of mixed cystic and solid nodules >/= 1.5 cm not described ?below ([place]): 0 ?  ?_________________________________________________________ ?  ?Nodule labeled 1 is a solid and cystic isoechoic nodule (TR 2) ?measuring up to 1.0 cm in the mid right thyroid lobe. This nodule ?does NOT meet TI-RADS criteria for biopsy or dedicated follow-up. ?  ?Nodule labeled 2 is a solid very hypoechoic nodule (TR 4) which ?appears to be within the most inferior aspect of the right thyroid ?lobe and measures 1.6 x 1.3 x 0.7 cm. **Given size (>/= 1.5 cm) and ?appearance, fine needle aspiration of this moderately suspicious ?nodule should be considered based on TI-RADS criteria. ?  ?IMPRESSION: ?1. Borderline enlarged multinodular thyroid gland. ?2. Nodule labeled 2 in the inferior right thyroid lobe meets ?criteria for biopsy. ?  ? ? ?FNA 08/13/2021 ? ?Clinical History: Nodule labeled 2 is a solid very hypoechoic nodule  ?(TR4) which appears to be within the most inferior aspect of the right  ?thyroid lobe and measures 1.6 x 1.3 x 0.7cm.  ?Specimen Submitted:  A. THYROID, RIGHT INFERIOR, FINE NEEDLE ASPIRATION:  ? ? ?FINAL MICROSCOPIC DIAGNOSIS:  ?- Atypia of undetermined significance (Bethesda category III)  ? ?Afirma Insufficient  ? ?ASSESSMENT / PLAN / RECOMMENDATIONS:  ? ?1) Type 2 Diabetes Mellitus, Poorly   controlled, Without complications - Most recent A1c of 9.4 %. Goal A1c < 7.0 %.   ? ? ?-Poorly controlled diabetes due to medication nonadherence, patient with variable intolerance to medications ?-Patient intoleran

## 2021-10-30 NOTE — Patient Instructions (Signed)
-   Stop Metformin  ?- Stop Jardiance  ?- Start Glipizide 5 mg, 1 tablet before first and last meal of the day  ? ? ?HOW TO TREAT LOW BLOOD SUGARS (Blood sugar LESS THAN 70 MG/DL) ?Please follow the RULE OF 15 for the treatment of hypoglycemia treatment (when your (blood sugars are less than 70 mg/dL)  ? ?STEP 1: Take 15 grams of carbohydrates when your blood sugar is low, which includes:  ?3-4 GLUCOSE TABS  OR ?3-4 OZ OF JUICE OR REGULAR SODA OR ?ONE TUBE OF GLUCOSE GEL   ? ?STEP 2: RECHECK blood sugar in 15 MINUTES ?STEP 3: If your blood sugar is still low at the 15 minute recheck --> then, go back to STEP 1 and treat AGAIN with another 15 grams of carbohydrates. ? ?

## 2021-11-02 LAB — PARATHYROID HORMONE, INTACT (NO CA): PTH: 141 pg/mL — ABNORMAL HIGH (ref 16–77)

## 2021-11-02 MED ORDER — ERGOCALCIFEROL 1.25 MG (50000 UT) PO CAPS: 50000.0000 [IU] | ORAL_CAPSULE | ORAL | 2 refills | Status: DC

## 2021-11-12 ENCOUNTER — Ambulatory Visit: Payer: Medicaid Other | Admitting: Internal Medicine

## 2021-11-19 ENCOUNTER — Encounter: Payer: Self-pay | Admitting: Obstetrics

## 2021-11-19 ENCOUNTER — Other Ambulatory Visit (HOSPITAL_COMMUNITY)
Admission: RE | Admit: 2021-11-19 | Discharge: 2021-11-19 | Disposition: A | Discharge status: Home / Self Care | Payer: Medicaid Other | Source: Ambulatory Visit | Attending: Obstetrics | Admitting: Obstetrics

## 2021-11-19 ENCOUNTER — Ambulatory Visit (INDEPENDENT_AMBULATORY_CARE_PROVIDER_SITE_OTHER): Payer: Medicaid Other | Admitting: Obstetrics

## 2021-11-19 VITALS — BP 168/101 | HR 67 | Ht 67.0 in | Wt 218.7 lb

## 2021-11-19 DIAGNOSIS — Z3169 Encounter for other general counseling and advice on procreation: Secondary | ICD-10-CM

## 2021-11-19 DIAGNOSIS — N898 Other specified noninflammatory disorders of vagina: Secondary | ICD-10-CM

## 2021-11-19 DIAGNOSIS — Z01419 Encounter for gynecological examination (general) (routine) without abnormal findings: Secondary | ICD-10-CM | POA: Diagnosis present

## 2021-11-19 DIAGNOSIS — Z113 Encounter for screening for infections with a predominantly sexual mode of transmission: Secondary | ICD-10-CM

## 2021-11-19 DIAGNOSIS — A6000 Herpesviral infection of urogenital system, unspecified: Secondary | ICD-10-CM | POA: Diagnosis not present

## 2021-11-19 DIAGNOSIS — R52 Pain, unspecified: Secondary | ICD-10-CM

## 2021-11-19 DIAGNOSIS — B3731 Acute candidiasis of vulva and vagina: Secondary | ICD-10-CM

## 2021-11-19 MED ORDER — VALACYCLOVIR HCL 1 G PO TABS: 1000.0000 mg | ORAL_TABLET | Freq: Two times a day (BID) | ORAL | 11 refills | Status: AC

## 2021-11-19 MED ORDER — IBUPROFEN 800 MG PO TABS: 800.0000 mg | ORAL_TABLET | Freq: Three times a day (TID) | ORAL | 5 refills | Status: AC | PRN

## 2021-11-19 MED ORDER — FLUCONAZOLE 150 MG PO TABS: 150.0000 mg | ORAL_TABLET | ORAL | 2 refills | Status: DC

## 2021-11-19 NOTE — Progress Notes (Signed)
Subjective:        Emma Brown is a 36 y.o. female here for a routine exam.  Current complaints: Vaginal discharge.    Personal health questionnaire:  Is patient Ashkenazi Jewish, have a family history of breast and/or ovarian cancer: no Is there a family history of uterine cancer diagnosed at age < 67, gastrointestinal cancer, urinary tract cancer, family member who is a Field seismologist syndrome-associated carrier: no Is the patient overweight and hypertensive, family history of diabetes, personal history of gestational diabetes, preeclampsia or PCOS: no Is patient over 73, have PCOS,  family history of premature CHD under age 10, diabetes, smoke, have hypertension or peripheral artery disease:  no At any time, has a partner hit, kicked or otherwise hurt or frightened you?: no Over the past 2 weeks, have you felt down, depressed or hopeless?: no Over the past 2 weeks, have you felt little interest or pleasure in doing things?:no   Gynecologic History Patient's last menstrual period was 08/19/2021. Contraception: none Last Pap: 2022. Results were: normal Last mammogram: n/a. Results were: n/a  Obstetric History OB History  Gravida Para Term Preterm AB Living  '3 3   3   3  '$ SAB IAB Ectopic Multiple Live Births               # Outcome Date GA Lbr Len/2nd Weight Sex Delivery Anes PTL Lv  3 Preterm           2 Preterm           1 Preterm             Past Medical History:  Diagnosis Date   Arthritis    Depression    Diabetes mellitus without complication ([place])    Dyspnea    Fibroid    Gestational diabetes    Hypertension    Polycystic ovarian disease    Pregnancy induced hypertension    Preterm labor    Vaginal Pap smear, abnormal     Past Surgical History:  Procedure Laterality Date   CESAREAN SECTION     OVARIAN CYST DRAINAGE     OVARIAN CYST DRAINAGE     WISDOM TOOTH EXTRACTION       Current Outpatient Medications:    amLODipine (NORVASC) 10 MG tablet, Take 10  mg by mouth daily., Disp: , Rfl:    buPROPion (WELLBUTRIN XL) 300 MG 24 hr tablet, Take 300 mg by mouth daily., Disp: , Rfl:    ergocalciferol (VITAMIN D2) 1.25 MG (50000 UT) capsule, Take 1 capsule (50,000 Units total) by mouth once a week., Disp: 13 capsule, Rfl: 2   fluconazole (DIFLUCAN) 150 MG tablet, Take 1 tablet (150 mg total) by mouth every 3 (three) days., Disp: 2 tablet, Rfl: 2   glipiZIDE (GLUCOTROL) 5 MG tablet, Take 1 tablet (5 mg total) by mouth 2 (two) times daily before a meal., Disp: 180 tablet, Rfl: 3   glucose blood (ACCU-CHEK GUIDE) test strip, 1 each by Other route daily in the afternoon. Use as instructed, Disp: 100 each, Rfl: 3   ibuprofen (ADVIL) 800 MG tablet, Take 1 tablet (800 mg total) by mouth every 8 (eight) hours as needed., Disp: 30 tablet, Rfl: 5   valACYclovir (VALTREX) 1000 MG tablet, Take 1 tablet (1,000 mg total) by mouth 2 (two) times daily., Disp: 30 tablet, Rfl: 11   Prenat w/o A Vit-FeFum-FePo-FA (CONCEPT OB) 130-92.4-1 MG CAPS, Take 1 capsule by mouth daily before breakfast. (Patient not taking: Reported on  11/19/2021), Disp: 90 capsule, Rfl: 3   valsartan (DIOVAN) 160 MG tablet, Take 160 mg by mouth daily. (Patient not taking: Reported on 11/19/2021), Disp: , Rfl:  Allergies  Allergen Reactions   Pineapple Swelling   Shellfish Allergy Shortness Of Breath    Social History   Tobacco Use   Smoking status: Never    Passive exposure: Never   Smokeless tobacco: Never  Substance Use Topics   Alcohol use: No    Family History  Problem Relation Age of Onset   Cancer Mother    Hypertension Mother    Hypertension Brother    Hypertension Maternal Aunt    Hypertension Maternal Uncle    Hypertension Maternal Grandmother    Cancer Maternal Grandfather       Review of Systems  Constitutional: negative for fatigue and weight loss Respiratory: negative for cough and wheezing Cardiovascular: negative for chest pain, fatigue and  palpitations Gastrointestinal: negative for abdominal pain and change in bowel habits Musculoskeletal:negative for myalgias Neurological: negative for gait problems and tremors Behavioral/Psych: negative for abusive relationship, depression Endocrine: negative for temperature intolerance    Genitourinary: positive fof vaginal discharge.  negative for abnormal menstrual periods, genital lesions, hot flashes, sexual problems  Integument/breast: negative for breast lump, breast tenderness, nipple discharge and skin lesion(s)    Objective:       BP (!) 168/101   Pulse 67   Ht '5\' 7"'$  (1.702 m)   Wt 218 lb 11.2 oz (99.2 kg)   LMP 08/19/2021   BMI 34.25 kg/m  General:   Alert and no distress  Skin:   no rash or abnormalities  Lungs:   clear to auscultation bilaterally  Heart:   regular rate and rhythm, S1, S2 normal, no murmur, click, rub or gallop  Breasts:   normal without suspicious masses, skin or nipple changes or axillary nodes  Abdomen:  normal findings: no organomegaly, soft, non-tender and no hernia  Pelvis:  External genitalia: normal general appearance Urinary system: urethral meatus normal and bladder without fullness, nontender Vaginal: normal without tenderness, induration or masses Cervix: normal appearance Adnexa: normal bimanual exam Uterus: anteverted and non-tender, normal size   Lab Review Urine pregnancy test Labs reviewed no Radiologic studies reviewed no  I have spent a total of 20 minutes of face-to-face time, excluding clinical staff time, reviewing notes and preparing to see patient, ordering tests and/or medications, and counseling the patient.   Assessment:    1. Encounter for routine gynecological examination with Papanicolaou smear of cervix Rx: - Cytology - PAP( [place])  2. Vaginal discharge Rx: - Cervicovaginal ancillary only  3. 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  Dispense: 30 tablet; Refill: 11  5. Encounter for preconception consultation - folic acid recommended for NTD prevention  6. Pain Rx: - ibuprofen (ADVIL) 800 MG tablet; Take 1 tablet (800 mg total) by mouth every 8 (eight) hours as needed.  Dispense: 30 tablet; Refill: 5  7. Candida vaginitis Rx: - fluconazole (DIFLUCAN) 150 MG tablet; Take 1 tablet (150 mg total) by mouth every 3 (three) days.  Dispense: 2 tablet; Refill: 2      Plan:    Education reviewed: calcium supplements, depression evaluation, low fat, low  cholesterol diet, safe sex/STD prevention, self breast exams, and weight bearing exercise. Follow up in: 1 year.   Meds ordered this encounter  Medications   valACYclovir (VALTREX) 1000 MG tablet    Sig: Take 1 tablet (1,000 mg total) by mouth 2 (two) times daily.    Dispense:  30 tablet    Refill:  11   ibuprofen (ADVIL) 800 MG tablet    Sig: Take 1 tablet (800 mg total) by mouth every 8 (eight) hours as needed.    Dispense:  30 tablet    Refill:  5   fluconazole (DIFLUCAN) 150 MG tablet    Sig: Take 1 tablet (150 mg total) by mouth every 3 (three) days.    Dispense:  2 tablet    Refill:  2   Orders Placed This Encounter  Procedures   Hepatitis B surface antigen   Hepatitis C antibody   RPR   HIV Antibody (routine testing w rflx)     Shelly Bombard, MD 11/19/2021 7:54 PM

## 2021-11-19 NOTE — Progress Notes (Signed)
Pt presents for annual reports reoccurring yeast infections.  She requests all STD testing. Pt requests meds for HSV 2

## 2021-11-20 ENCOUNTER — Other Ambulatory Visit (HOSPITAL_COMMUNITY)
Admission: RE | Admit: 2021-11-20 | Discharge: 2021-11-20 | Disposition: A | Discharge status: Home / Self Care | Payer: Medicaid Other | Source: Ambulatory Visit | Attending: Interventional Radiology | Admitting: Interventional Radiology

## 2021-11-20 ENCOUNTER — Ambulatory Visit
Admission: RE | Admit: 2021-11-20 | Discharge: 2021-11-20 | Disposition: A | Discharge status: Home / Self Care | Payer: Medicaid Other | Source: Ambulatory Visit | Attending: Internal Medicine | Admitting: Internal Medicine

## 2021-11-20 DIAGNOSIS — E042 Nontoxic multinodular goiter: Secondary | ICD-10-CM | POA: Diagnosis present

## 2021-11-20 LAB — HEPATITIS B SURFACE ANTIGEN: Hepatitis B Surface Ag: NEGATIVE

## 2021-11-20 LAB — HIV ANTIBODY (ROUTINE TESTING W REFLEX): HIV Screen 4th Generation wRfx: NONREACTIVE

## 2021-11-20 LAB — HEPATITIS C ANTIBODY: Hep C Virus Ab: NONREACTIVE

## 2021-11-20 LAB — RPR: RPR Ser Ql: NONREACTIVE

## 2021-11-20 IMAGING — US US FNA BIOPSY THYROID 1ST LESION
1 series · 13 of 17 positions shown · non-contrast
Comparison: None Available.

MEDICATIONS:
None

COMPLICATIONS:
None immediate.

INDICATION: Indeterminate thyroid nodule

EXAM:
ULTRASOUND GUIDED FINE NEEDLE ASPIRATION OF INDETERMINATE THYROID
NODULE
TECHNIQUE: Informed written consent was obtained from the patient after a
discussion of the risks, benefits and alternatives to treatment.
Questions regarding the procedure were encouraged and answered. A
timeout was performed prior to the initiation of the procedure.

[Series 1: us fna biopsy thyroid 1st lesion · 0.06mm/px · 17 acquisitions, 13 frames shown]
[im 1/17]
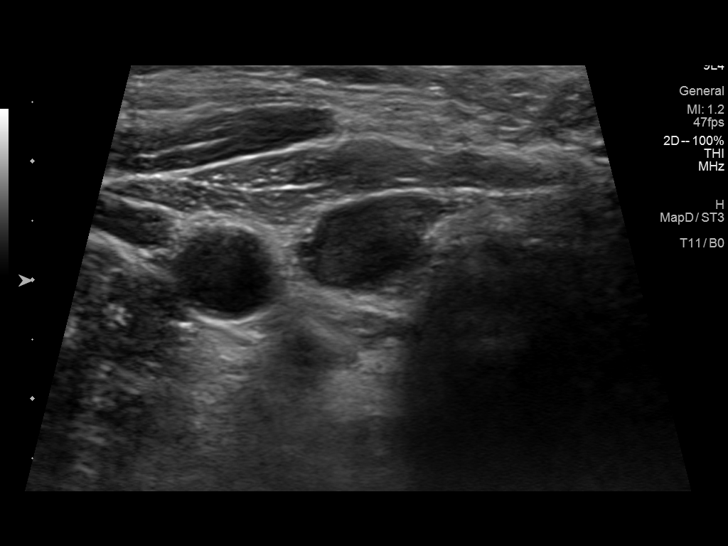
[im 2/17]
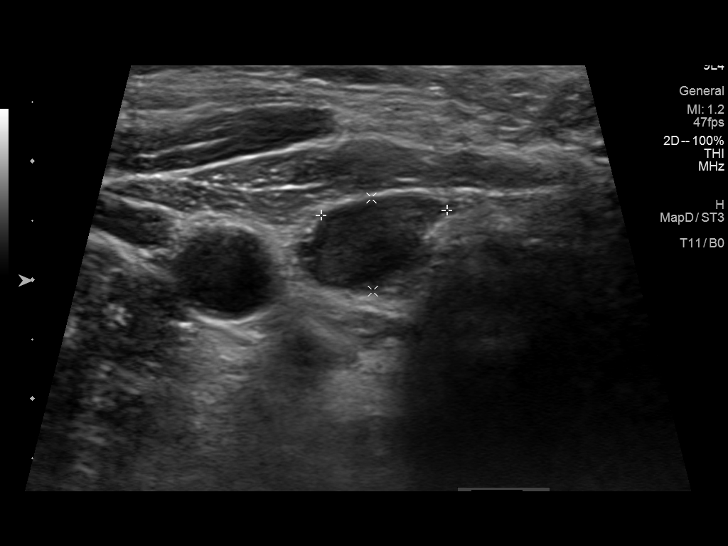
[im 4/17]
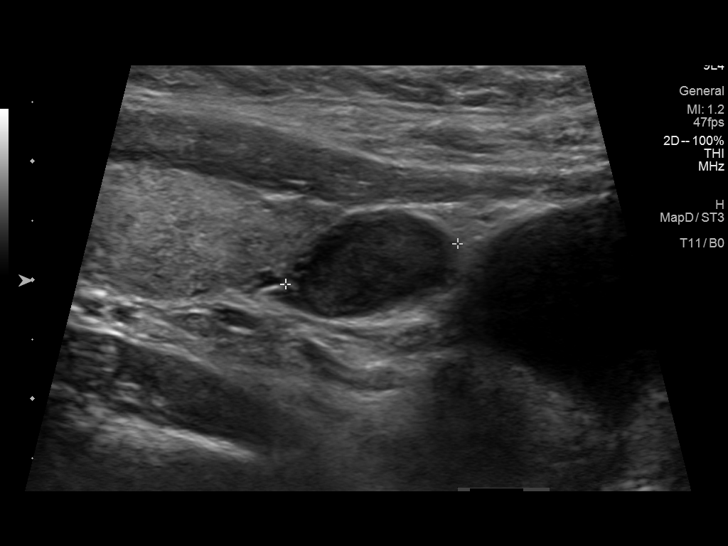
[im 5/17]
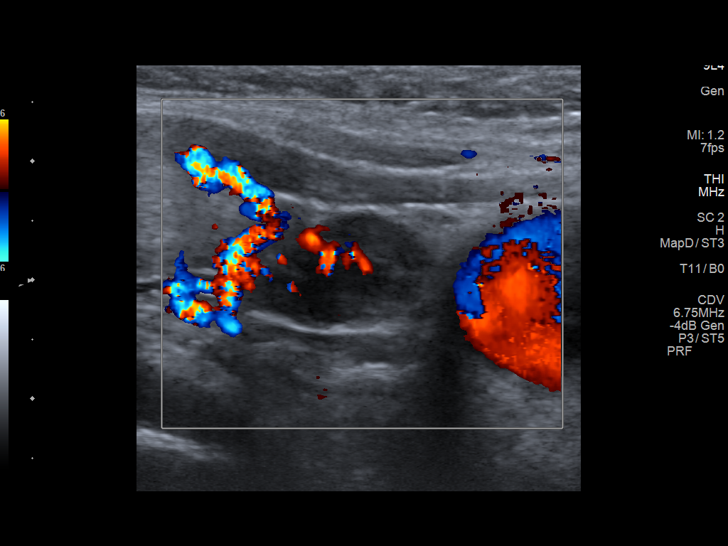
[im 6/17]
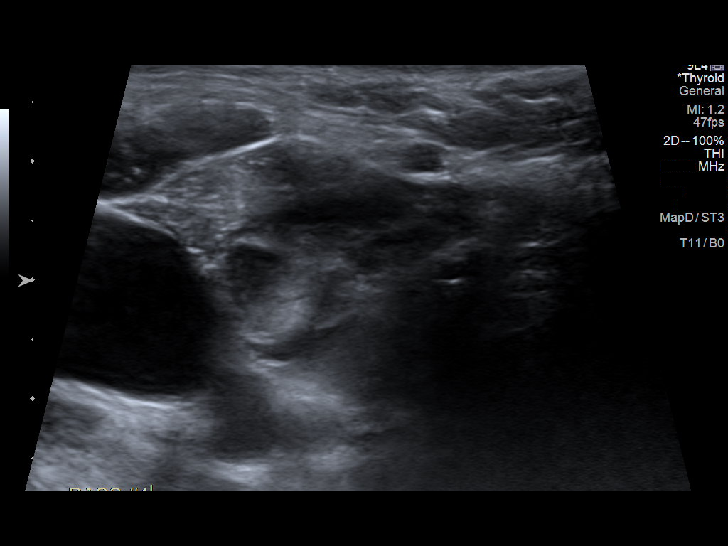
[im 8/17]
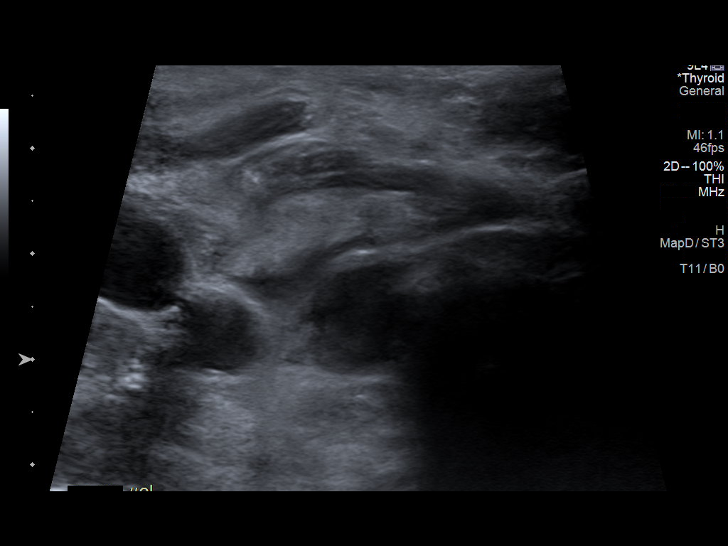
[im 9/17]
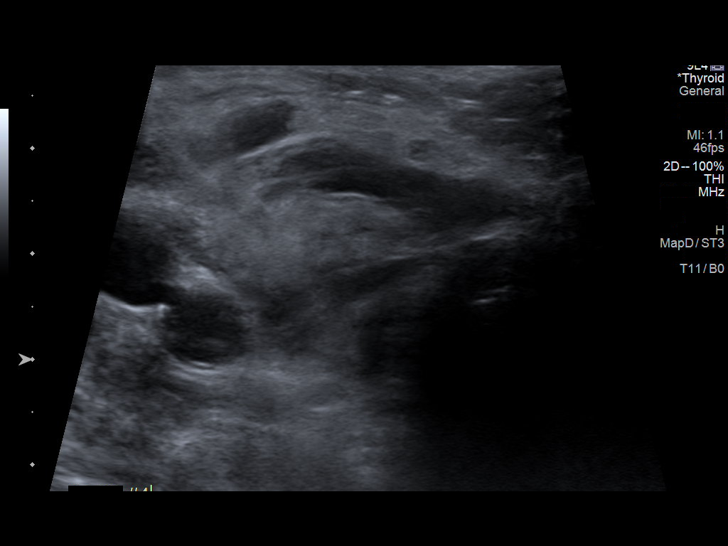
[im 10/17]
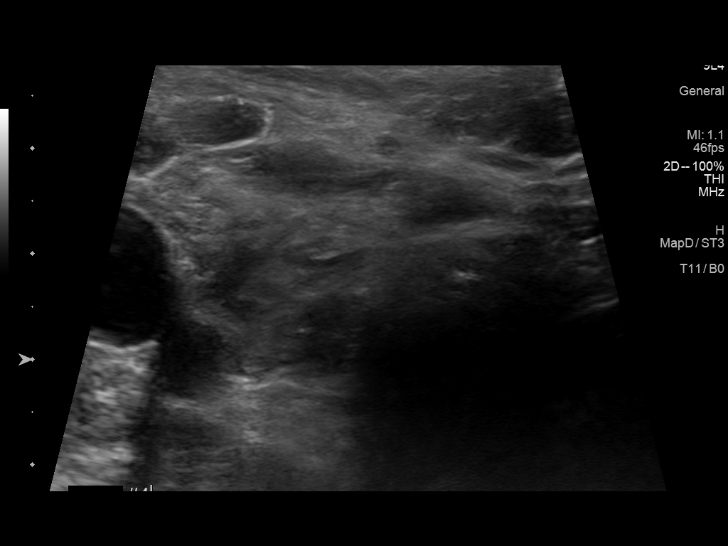
[im 12/17]
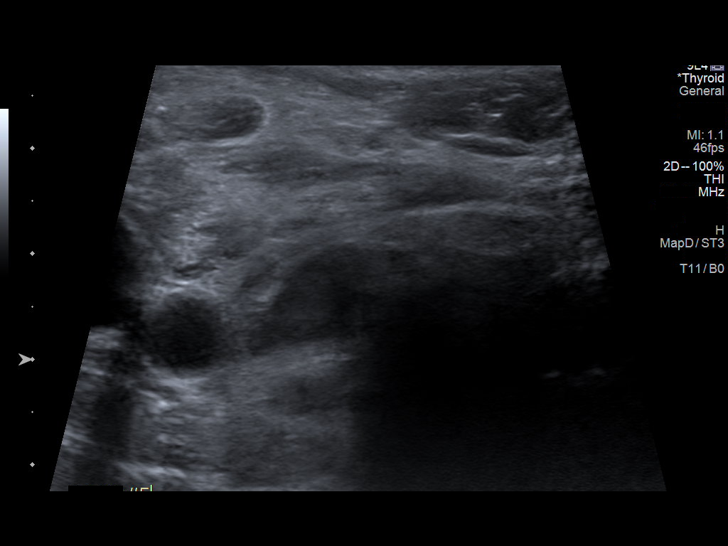
[im 13/17]
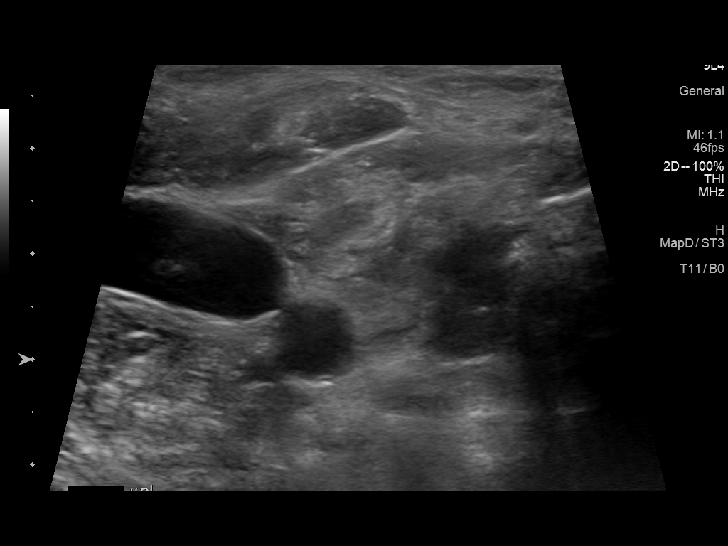
[im 14/17]
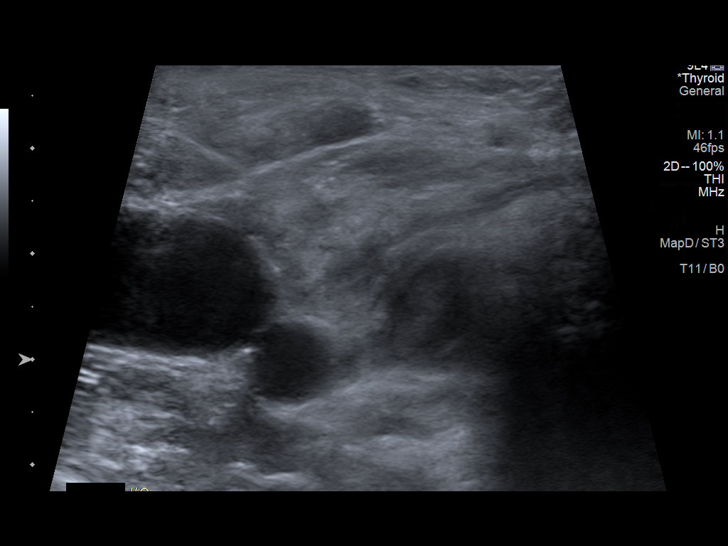
[im 16/17]
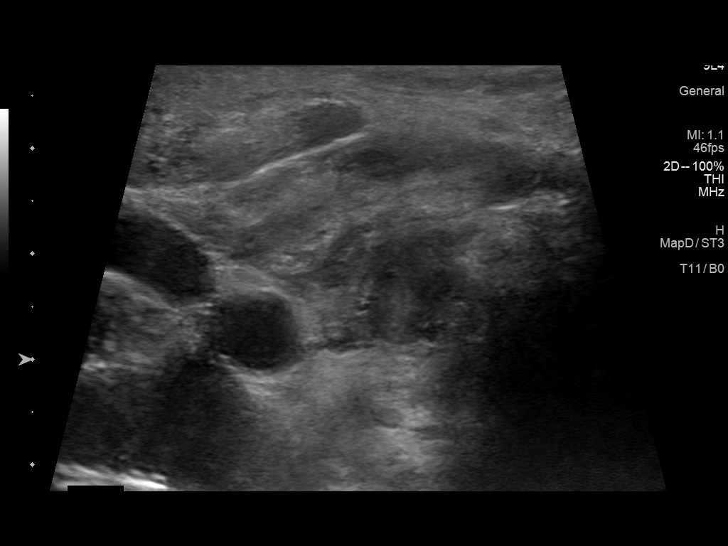
[im 17/17]
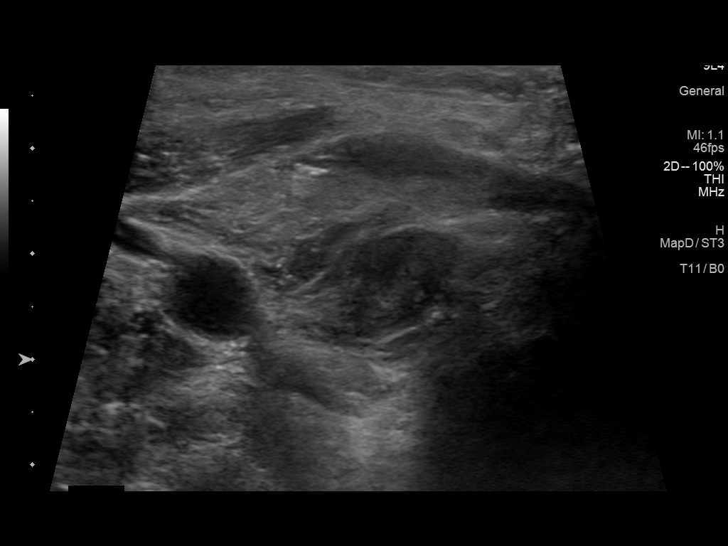

[13 of 17 positions shown; findings below may reference images not displayed]

Pre-procedural ultrasound scanning demonstrated unchanged size and
appearance of the indeterminate nodule within the inferior right
thyroid lobe.

The procedure was planned. The neck was prepped in the usual sterile
fashion, and a sterile drape was applied covering the operative
field. A timeout was performed prior to the initiation of the
procedure. Local anesthesia was provided with 1% lidocaine.

Under direct ultrasound guidance, Four 25 ga and two 22 ga FNA
biopsies were performed of the right inferior thyroid nodule with
multiple ultrasound images were saved for procedural documentation
purposes. The samples were prepared and submitted to pathology.

Limited post procedural scanning was negative for hematoma or
additional complication. Dressings were placed. The patient
tolerated the above procedures procedure well without immediate
postprocedural complication.
FINDINGS: Nodule reference number based on prior diagnostic ultrasound: 2

Maximum size: 1.5 cm

Location: Right; inferior

ACR TI-RADS risk category: TR4 (4-6 points)

Reason for biopsy: meets other recommendations

Ultrasound imaging confirms appropriate placement of the needles
within the thyroid nodule.
IMPRESSION: Technically successful ultrasound guided fine needle aspiration of
right inferior thyroid nodule.

## 2021-11-23 LAB — CERVICOVAGINAL ANCILLARY ONLY
Bacterial Vaginitis (gardnerella): NEGATIVE
Candida Glabrata: NEGATIVE
Candida Vaginitis: NEGATIVE
Chlamydia: NEGATIVE
Comment: NEGATIVE
Comment: NEGATIVE
Comment: NEGATIVE
Comment: NEGATIVE
Comment: NEGATIVE
Comment: NORMAL
Neisseria Gonorrhea: NEGATIVE
Trichomonas: NEGATIVE

## 2021-11-23 LAB — CYTOLOGY - NON PAP

## 2021-11-24 LAB — CYTOLOGY - PAP
Comment: NEGATIVE
Diagnosis: UNDETERMINED — AB
High risk HPV: NEGATIVE

## 2021-12-08 ENCOUNTER — Telehealth: Payer: Self-pay | Admitting: Internal Medicine

## 2021-12-08 ENCOUNTER — Encounter (HOSPITAL_COMMUNITY): Payer: Self-pay

## 2021-12-08 NOTE — Telephone Encounter (Signed)
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

## 2021-12-25 ENCOUNTER — Encounter: Payer: Self-pay | Admitting: Dietician

## 2021-12-25 ENCOUNTER — Encounter: Payer: Medicaid Other | Attending: Internal Medicine | Admitting: Dietician

## 2021-12-25 DIAGNOSIS — Z713 Dietary counseling and surveillance: Secondary | ICD-10-CM | POA: Diagnosis not present

## 2021-12-25 DIAGNOSIS — E559 Vitamin D deficiency, unspecified: Secondary | ICD-10-CM

## 2021-12-25 DIAGNOSIS — E1165 Type 2 diabetes mellitus with hyperglycemia: Secondary | ICD-10-CM | POA: Diagnosis not present

## 2021-12-25 NOTE — Progress Notes (Signed)
Diabetes Self-Management Education  Visit Type: First/Initial  Appt. Start Time: 1000 (Late)  Appt. End Time: 1130  12/25/2021  [name], identified by name and date of birth, is a 36 y.o. female with a diagnosis of Diabetes: Type 2.   ASSESSMENT Patient is here today with her husband.  Last educated 2014 with GDM. Patient states that she has not taken her Valsartan in the past 2 weeks as she does not have a refill.  Pharmacy was asked to contact MD 2 weeks ago. RN in our office checked her blood pressure which was 148/102.  Patient stated that her blood pressure has been running high.   Called her MD (Dr. Criss Rosales).  Appointment made for patient to see a Nurse Practitioner at that office tomorrow for prescription refills.  She has resumed her metformin as she was worried about her blood sugar.  She continues to have GI side effects.  Discussed that Metformin should be taken with food.  She was not aware of this and will begin to do this.  History includes includes Type 2 diabetes (2017), HTN, constipation, GDM Labs noted to include:  A1C 9.4% (10/30/2021) increased from 8% (07/13/2021), vitamin D 15, GFR 98, (10/30/2021) Medications:  Glipizide  (Did not tolerate ozempic, trulicity, metformin (GI side effects), Jardiance (yeast infections), prenatal vitamin, Vitamin D Sleep:  4 hours uninterrupted on work days and 5 hours on days that she is off  Weight hx: 67" 222 lbs 12/25/2021 219 lbs 10/30/2021 211 lbs 10/13/2021 UBW 210-220 lbs  Patient lives with her husband and 3 children.  She does the shopping and share cooking.  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  She does not always have healthy food choice options at work.  E-mail sent to nutrition team that works on improving health of hospital/employees to work on improving this. Her husband just had a MI, her mother just passed.  Stress is high. Husband was just diagnosed with diabetes  (type 2 vs type 1) as well. 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 Forgets to take her medications at times.  Height '5\' 7"'$  (1.702 m), weight 222 lb (100.7 kg). Body mass index is 34.77 kg/m.   Diabetes Self-Management Education - 12/25/21 1034       Visit Information   Visit Type First/Initial      Initial Visit   Diabetes Type Type 2    Date Diagnosed 2017    Are you currently following a meal plan? No    Are you taking your medications as prescribed? Yes      Health Coping   How would you rate your overall health? Fair      Psychosocial Assessment   Patient Belief/Attitude about Diabetes Motivated to manage diabetes    What is the hardest part about your diabetes right now, causing you the most concern, or is the most worrisome to you about your diabetes?   Checking blood sugar;Taking/obtaining medications    Self-care barriers None    Self-management support Doctor's office;Family    Other persons present Patient;Spouse/SO    Patient Concerns Nutrition/Meal planning;Problem Solving    Special Needs None    Preferred Learning Style No preference indicated    Learning Readiness Ready    How often do you need to have someone help you when you read instructions, pamphlets, or other written materials from your doctor or pharmacy? 1 - Never    What is the  last grade level you completed in school? some college      Pre-Education Assessment   Patient understands the diabetes disease and treatment process. Needs Review    Patient understands incorporating nutritional management into lifestyle. Needs Review    Patient undertands incorporating physical activity into lifestyle. Needs Review    Patient understands using medications safely. Needs Review    Patient understands monitoring blood glucose, interpreting and using results Needs Review    Patient understands prevention, detection, and treatment of acute complications. Needs Review    Patient understands prevention, detection,  and treatment of chronic complications. Needs Review    Patient understands how to develop strategies to address psychosocial issues. Needs Review    Patient understands how to develop strategies to promote health/change behavior. Needs Review      Complications   Last HgB A1C per patient/outside source 9.4 %   10/30/2021 increased from 8% 06/2021   How often do you check your blood sugar? 3-4 times/day    Fasting Blood glucose range (mg/dL) 130-179   145-169   Postprandial Blood glucose range (mg/dL) 180-200    Number of hypoglycemic episodes per month 0    Number of hyperglycemic episodes ( >'200mg'$ /dL): Occasional    Can you tell when your blood sugar is high? Yes    What do you do if your blood sugar is high? drinks more water    Have you had a dilated eye exam in the past 12 months? No    Have you had a dental exam in the past 12 months? No    Are you checking your feet? Yes    How many days per week are you checking your feet? 7      Dietary Intake   Lunch burger and fries OR chicken wings and fries OR mashed potatoes and meat   6 pm   Snack (afternoon) --    Dinner Salad, fruit   11 pm   Snack (evening) chips    Beverage(s) water, diet soda, celcius, juice occasionally      Activity / Exercise   Activity / Exercise Type Light (walking / raking leaves)   only at work     Patient Education   Previous Diabetes Education Yes (please comment)   2014 GDM   Disease Pathophysiology Definition of diabetes, type 1 and 2, and the diagnosis of diabetes;Explored patient's options for treatment of their diabetes    Healthy Eating Role of diet in the treatment of diabetes and the relationship between the three main macronutrients and blood glucose level;Food label reading, portion sizes and measuring food.;Plate Method;Carbohydrate counting;Meal options for control of blood glucose level and chronic complications.;Meal timing in regards to the patients' current diabetes medication.    Being  Active Role of exercise on diabetes management, blood pressure control and cardiac health.    Medications Reviewed patients medication for diabetes, action, purpose, timing of dose and side effects.    Monitoring Yearly dilated eye exam;Daily foot exams;Identified appropriate SMBG and/or A1C goals.;Taught/discussed recording of test results and interpretation of SMBG.    Acute complications Discussed and identified patients' prevention, symptoms, and treatment of hyperglycemia.;Taught prevention, symptoms, and  treatment of hypoglycemia - the 15 rule.    Chronic complications Relationship between chronic complications and blood glucose control    Diabetes Stress and Support Identified and addressed patients feelings and concerns about diabetes;Worked with patient to identify barriers to care and solutions;Role of stress on diabetes    Preconception care Role  of family planning for patients with diabetes      Individualized Goals (developed by patient)   Nutrition General guidelines for healthy choices and portions discussed    Medications take my medication as prescribed    Monitoring  Test my blood glucose as discussed   call insurance about CGM options   Problem Solving Sleep Pattern;Addressing barriers to behavior change    Reducing Risk examine blood glucose patterns;do foot checks daily;treat hypoglycemia with 15 grams of carbs if blood glucose less than '70mg'$ /dL      Post-Education Assessment   Patient understands the diabetes disease and treatment process. Demonstrates understanding / competency    Patient understands incorporating nutritional management into lifestyle. Comprehends key points    Patient undertands incorporating physical activity into lifestyle. Comprehends key points    Patient understands using medications safely. Demonstrates understanding / competency    Patient understands monitoring blood glucose, interpreting and using results Demonstrates understanding / competency     Patient understands prevention, detection, and treatment of acute complications. Demonstrates understanding / competency    Patient understands prevention, detection, and treatment of chronic complications. Demonstrates understanding / competency    Patient understands how to develop strategies to address psychosocial issues. Comprehends key points    Patient understands how to develop strategies to promote health/change behavior. Comprehends key points      Outcomes   Expected Outcomes Demonstrated interest in learning. Expect positive outcomes    Future DMSE 3-4 months    Program Status Not Completed             Individualized Plan for Diabetes Self-Management Training:   Learning Objective:  Patient will have a greater understanding of diabetes self-management. Patient education plan is to attend individual and/or group sessions per assessed needs and concerns.   Plan:   Patient Instructions  Follow up with your Nurse practitioner on 12/26/2021 to obtain prescription refills. Take Metformin with food.  Consider 2 Tablespoons of ground flax seed daily.  Store in the freezer. Eye exam Would Dexcom or Elenor Legato be covered by your insurance?  Call insurance and message Dr. Kelton Pillar.     Mindful choices Stay active Vegetables, fruit, whole grains, lean protein   Expected Outcomes:  Demonstrated interest in learning. Expect positive outcomes  Education material provided: ADA - How to Thrive: A Guide for Your Journey with Diabetes, Food label handouts, Meal plan card, and Diabetes Resources  If problems or questions, patient to contact team via:  Phone  Future DSME appointment: 3-4 months

## 2021-12-25 NOTE — Patient Instructions (Addendum)
Follow up with your Nurse practitioner on 12/26/2021 to obtain prescription refills. Take Metformin with food.  Consider 2 Tablespoons of ground flax seed daily.  Store in the freezer. Eye exam Would Dexcom or Elenor Legato be covered by your insurance?  Call insurance and message Dr. Kelton Pillar.     Mindful choices Stay active Vegetables, fruit, whole grains, lean protein

## 2022-01-01 ENCOUNTER — Ambulatory Visit: Payer: Medicaid Other | Admitting: Cardiology

## 2022-01-08 ENCOUNTER — Ambulatory Visit: Payer: Medicaid Other | Admitting: Cardiology

## 2022-01-08 ENCOUNTER — Encounter: Payer: Self-pay | Admitting: Cardiology

## 2022-01-08 VITALS — BP 144/87 | HR 93 | Temp 98.0°F | Resp 16 | Ht 67.0 in | Wt 219.0 lb

## 2022-01-08 DIAGNOSIS — I10 Essential (primary) hypertension: Secondary | ICD-10-CM

## 2022-01-08 DIAGNOSIS — E119 Type 2 diabetes mellitus without complications: Secondary | ICD-10-CM

## 2022-01-08 DIAGNOSIS — R0609 Other forms of dyspnea: Secondary | ICD-10-CM

## 2022-01-08 MED ORDER — CHLORTHALIDONE 25 MG PO TABS: 25.0000 mg | ORAL_TABLET | Freq: Every morning | ORAL | 0 refills | Status: AC

## 2022-01-08 NOTE — Progress Notes (Signed)
ID:  Emma Brown, DOB Feb 25, 1986, MRN [id number]  PCP:  Lucianne Lei, MD  Cardiologist:  Rex Kras, DO, [facility] (established care 01/08/2022)  REASON FOR CONSULT: Evaluation for CAD.  REQUESTING PHYSICIAN:  Lucianne Lei, MD [address] [place],  [place] [address]  Chief Complaint  Patient presents with   New Patient (Initial Visit)   Shortness of Breath    HPI  Emma Brown is a 36 y.o. African-American female who presents to the clinic for evaluation of CAD at the request of Lucianne Lei, MD. Her past medical history and cardiovascular risk factors include: HTN, NIDDM Type II, hyperparathyroidism, prior history of preeclampsia during her second pregnancy and eclampsia during her third pregnancy per patient.  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 She has been experiencing shortness of breath with effort related activities for the last 8 to 9 months.  Patient states that it takes little to no effort before she becomes symptomatic.  She is seeking medical evaluation of the intensity, frequency, and duration has gotten progressively worse over the last 2 months.  She denies orthopnea, paroxysmal nocturnal dyspnea or lower extremity swelling.  At times she may have nonspecific precordial pain associated with the symptoms.  No prior history of near-syncope or syncope.  Home BP are not well controlled SBP 130-140 ranges between  and DBP 90-138mHG.   She has had 3 pregnancies and has 3 children.  All of the 3 kids have been premature in the second and third pregnancy were complicated by preeclampsia and eclampsia respectively per patient.  No family history of premature coronary artery disease, sudden cardiac death, cardiomyopathy.  FUNCTIONAL STATUS: No structured exercise program or daily routine.   ALLERGIES: Allergies  Allergen Reactions   Pineapple Swelling   Shellfish Allergy Shortness Of Breath    MEDICATION LIST PRIOR TO  VISIT: Current Meds  Medication Sig   amLODipine (NORVASC) 10 MG tablet Take 10 mg by mouth daily.   buPROPion (WELLBUTRIN XL) 300 MG 24 hr tablet Take 300 mg by mouth daily.   chlorthalidone (HYGROTON) 25 MG tablet Take 1 tablet (25 mg total) by mouth every morning.   ergocalciferol (VITAMIN D2) 1.25 MG (50000 UT) capsule Take 1 capsule (50,000 Units total) by mouth once a week.   glipiZIDE (GLUCOTROL) 5 MG tablet Take 1 tablet (5 mg total) by mouth 2 (two) times daily before a meal.   glucose blood (ACCU-CHEK GUIDE) test strip 1 each by Other route daily in the afternoon. Use as instructed   ibuprofen (ADVIL) 800 MG tablet Take 1 tablet (800 mg total) by mouth every 8 (eight) hours as needed.   metFORMIN (GLUCOPHAGE) 500 MG tablet Take 500 mg by mouth daily with breakfast.   valACYclovir (VALTREX) 1000 MG tablet Take 1 tablet (1,000 mg total) by mouth 2 (two) times daily.   valsartan (DIOVAN) 160 MG tablet Take 160 mg by mouth daily.     PAST MEDICAL HISTORY: Past Medical History:  Diagnosis Date   Arthritis    Depression    Diabetes mellitus without complication [place]    Dyspnea    Fibroid    Gestational diabetes    Hypertension    Polycystic ovarian disease    Pregnancy induced hypertension    Preterm labor    Vaginal Pap smear, abnormal     PAST SURGICAL HISTORY: Past Surgical History:  Procedure Laterality Date   CESAREAN SECTION     OVARIAN CYST DRAINAGE  OVARIAN CYST DRAINAGE     WISDOM TOOTH EXTRACTION      FAMILY HISTORY: The patient family history includes Cancer in her maternal grandfather and mother; Diabetes in her father; Heart disease in her father; Hypertension in her brother, brother, father, maternal aunt, maternal grandmother, maternal uncle, and mother.  SOCIAL HISTORY:  The patient  reports that she has never smoked. She has never been exposed to tobacco smoke. She has never used smokeless tobacco. She reports current alcohol use. She reports that  she does not use drugs.  REVIEW OF SYSTEMS: Review of Systems  Cardiovascular:  Positive for dyspnea on exertion. Negative for chest pain, claudication, irregular heartbeat, leg swelling, near-syncope, orthopnea, palpitations, paroxysmal nocturnal dyspnea and syncope.  Respiratory:  Positive for shortness of breath.   Hematologic/Lymphatic: Negative for bleeding problem.  Musculoskeletal:  Negative for muscle cramps and myalgias.  Neurological:  Negative for dizziness and light-headedness.    PHYSICAL EXAM:    01/08/2022    9:19 AM 12/25/2021   10:21 AM 11/19/2021    4:00 PM  Vitals with BMI  Height 5' 7"  5' 7"    Weight 219 lbs 222 lbs   BMI 96.78 93.81   Systolic 017  510  Diastolic 87  258  Pulse 93  67    CONSTITUTIONAL: Well-developed and well-nourished. No acute distress.  SKIN: Skin is warm and dry. No rash noted. No cyanosis. No pallor. No jaundice HEAD: Normocephalic and atraumatic.  EYES: No scleral icterus MOUTH/THROAT: Moist oral membranes.  NECK: No JVD present.  Mild thyromegaly, no carotid bruits  CHEST Normal respiratory effort. No intercostal retractions  LUNGS: Clear to auscultation bilaterally.  No stridor. No wheezes. No rales.  CARDIOVASCULAR: Regular rate and rhythm, positive N2-D7, soft systolic murmur, no rubs or gallops appreciated. ABDOMINAL: Soft, nontender, nondistended, positive bowel sounds in all 4 quadrants, no apparent ascites.  EXTREMITIES: No peripheral edema, warm to touch, 2+ bilateral DP and PT pulses HEMATOLOGIC: No significant bruising NEUROLOGIC: Oriented to person, place, and time. Nonfocal. Normal muscle tone.  PSYCHIATRIC: Normal mood and affect. Normal behavior. Cooperative  CARDIAC DATABASE: EKG: 01/08/2022: NSR, 87 bpm, LVH per voltage criteria, ST-T changes likely secondary to LVH but inferolateral ischemia cannot be ruled out, nonspecific T wave abnormality.   Echocardiogram: No results found for this or any previous visit from  the past 1095 days.    Stress Testing: No results found for this or any previous visit from the past 1095 days.   Heart Catheterization: None  LABORATORY DATA:    Latest Ref Rng & Units 08/17/2021    3:43 PM  CBC  WBC 4.0 - 10.5 K/uL 7.8   Hemoglobin 12.0 - 15.0 g/dL 13.1   Hematocrit 36.0 - 46.0 % 39.9   Platelets 150 - 400 K/uL 411        Latest Ref Rng & Units 10/30/2021   10:45 AM 08/17/2021    3:43 PM 07/13/2021    2:53 PM  CMP  Glucose 70 - 99 mg/dL 226  175  83   BUN 6 - 23 mg/dL 14  13  9    Creatinine 0.40 - 1.20 mg/dL 0.77  1.06  0.70   Sodium 135 - 145 mEq/L 136  137  136   Potassium 3.5 - 5.1 mEq/L 3.9  3.6  4.4   Chloride 96 - 112 mEq/L 102  102  104   CO2 19 - 32 mEq/L 27  25  27    Calcium 8.4 - 10.5  mg/dL 10.7  9.4  10.9   Total Protein 6.0 - 8.3 g/dL   6.9   Total Bilirubin 0.2 - 1.2 mg/dL   0.4   Alkaline Phos 39 - 117 U/L   111   AST 0 - 37 U/L   14   ALT 0 - 35 U/L   13     Lipid Panel  No results found for: "CHOL", "TRIG", "HDL", "CHOLHDL", "VLDL", "LDLCALC", "LDLDIRECT", "LABVLDL"  No components found for: "NTPROBNP" No results for input(s): "PROBNP" in the last 8760 hours. Recent Labs    07/13/21 1453 08/17/21 1807 10/30/21 1045  TSH 1.16 0.766 0.98    BMP Recent Labs    07/13/21 1453 08/17/21 1543 10/30/21 1045  NA 136 137 136  K 4.4 3.6 3.9  CL 104 102 102  CO2 27 25 27   GLUCOSE 83 175* 226*  BUN 9 13 14   CREATININE 0.70 1.06* 0.77  CALCIUM 10.9* 9.4 10.7*  GFRNONAA  --  >60  --     HEMOGLOBIN A1C Lab Results  Component Value Date   HGBA1C 9.4 (A) 10/30/2021   External Labs: Collected: 12/28/2019 Total cholesterol 193, triglycerides 282, HDL 42, LDL 103  Collected: 10/30/2021 provided by PCP Sodium 136, potassium 3.9, chloride 102, bicarb 27, BUN 14, creatinine 0.77. eGFR >60 TSH 0.98  IMPRESSION:    ICD-10-CM   1. Dyspnea on exertion  R06.09 EKG 12-Lead    chlorthalidone (HYGROTON) 25 MG tablet    PCV  ECHOCARDIOGRAM COMPLETE    Pro b natriuretic peptide (BNP)    Pregnancy, urine    2. Non-insulin dependent type 2 diabetes mellitus (HCC)  E11.9 Lipid Panel With LDL/HDL Ratio    Lipoprotein A (LPA)    LDL cholesterol, direct    CMP14+EGFR    CT CARDIAC SCORING ([facility] LOCATIONS ONLY)    Pregnancy, urine    3. Benign hypertension  I10 chlorthalidone (HYGROTON) 25 MG tablet    Pregnancy, urine       RECOMMENDATIONS: Kindall Swaby is a 36 y.o. African-American female whose past medical history and cardiac risk factors include: HTN, NIDDM Type II, hyperparathyroidism, prior history of preeclampsia during her second pregnancy and eclampsia during her third pregnancy per patient.  Dyspnea on exertion Chronic and progressive.  Likely secondary to uncontrolled hypertension.  Echo will be ordered to evaluate for structural heart disease and left ventricular systolic function. Start chlorthalidone 25 mg p.o. every morning. Patient is advised to take amlodipine in the morning and valsartan at night. Encouraged to keep a log of her blood pressures and to bring them in the next office visit. Reemphasized importance of a low-salt diet Labs in 1 week after starting chlorthalidone.   Non-insulin dependent type 2 diabetes mellitus ([place]) Not well controlled. On oral therapy. Not on statin therapy. Check fasting lipid profile, direct LDL, LP(a)  Benign hypertension Educated the importance of blood pressure management. Recommendations discussed above. Check renal duplex She will need referral to sleep medicine for evaluation of sleep apnea.   FINAL MEDICATION LIST END OF ENCOUNTER: Meds ordered this encounter  Medications   chlorthalidone (HYGROTON) 25 MG tablet    Sig: Take 1 tablet (25 mg total) by mouth every morning.    Dispense:  90 tablet    Refill:  0    Medications Discontinued During This Encounter  Medication Reason   fluconazole (DIFLUCAN) 150 MG tablet    Prenat w/o A  Vit-FeFum-FePo-FA (CONCEPT OB) 130-92.4-1 MG CAPS  Current Outpatient Medications:    amLODipine (NORVASC) 10 MG tablet, Take 10 mg by mouth daily., Disp: , Rfl:    buPROPion (WELLBUTRIN XL) 300 MG 24 hr tablet, Take 300 mg by mouth daily., Disp: , Rfl:    chlorthalidone (HYGROTON) 25 MG tablet, Take 1 tablet (25 mg total) by mouth every morning., Disp: 90 tablet, Rfl: 0   ergocalciferol (VITAMIN D2) 1.25 MG (50000 UT) capsule, Take 1 capsule (50,000 Units total) by mouth once a week., Disp: 13 capsule, Rfl: 2   glipiZIDE (GLUCOTROL) 5 MG tablet, Take 1 tablet (5 mg total) by mouth 2 (two) times daily before a meal., Disp: 180 tablet, Rfl: 3   glucose blood (ACCU-CHEK GUIDE) test strip, 1 each by Other route daily in the afternoon. Use as instructed, Disp: 100 each, Rfl: 3   ibuprofen (ADVIL) 800 MG tablet, Take 1 tablet (800 mg total) by mouth every 8 (eight) hours as needed., Disp: 30 tablet, Rfl: 5   metFORMIN (GLUCOPHAGE) 500 MG tablet, Take 500 mg by mouth daily with breakfast., Disp: , Rfl:    valACYclovir (VALTREX) 1000 MG tablet, Take 1 tablet (1,000 mg total) by mouth 2 (two) times daily., Disp: 30 tablet, Rfl: 11   valsartan (DIOVAN) 160 MG tablet, Take 160 mg by mouth daily., Disp: , Rfl:   Orders Placed This Encounter  Procedures   CT CARDIAC SCORING ([facility] LOCATIONS ONLY)   Lipid Panel With LDL/HDL Ratio   Lipoprotein A (LPA)   LDL cholesterol, direct   CMP14+EGFR   Pro b natriuretic peptide (BNP)   Pregnancy, urine   EKG 12-Lead   PCV ECHOCARDIOGRAM COMPLETE    There are no Patient Instructions on file for this visit.   --Continue cardiac medications as reconciled in final medication list. --Return in about 4 weeks (around 02/05/2022) for Follow up, Dyspnea, Review test results. or sooner if needed. --Continue follow-up with your primary care physician regarding the management of your other chronic comorbid conditions.  Patient's questions and concerns were addressed  to her satisfaction. She voices understanding of the instructions provided during this encounter.   This note was created using a voice recognition software as a result there may be grammatical errors inadvertently enclosed that do not reflect the nature of this encounter. Every attempt is made to correct such errors.  Data Reviewed: I have independently reviewed external notes provided by the referring provider as part of this office visit.   I have independently reviewed results of EKG, outside labs as part of medical decision making. I have ordered the following tests:  Orders Placed This Encounter  Procedures   CT CARDIAC SCORING ([facility] LOCATIONS ONLY)    Standing Status:   Future    Standing Expiration Date:   01/09/2023    Order Specific Question:   Preferred imaging location?    Answer:   [facility]    Order Specific Question:   Release to patient    Answer:   Immediate    Order Specific Question:   Is patient pregnant?    Answer:   No   Lipid Panel With LDL/HDL Ratio   Lipoprotein A (LPA)   LDL cholesterol, direct   CMP14+EGFR   Pro b natriuretic peptide (BNP)    Standing Status:   Future    Standing Expiration Date:   04/10/2022   Pregnancy, urine   Urine Microalbumin w/creat. ratio   EKG 12-Lead   PCV ECHOCARDIOGRAM COMPLETE    Standing Status:   Future  Standing Expiration Date:   01/09/2023   I have made medications changes at today's encounter as noted above. History of present illness was obtained by both patient and review of EMR at today's office visit.    Rex Kras, [place], [facility]  Pager: [phone] Office: [phone]

## 2022-01-13 ENCOUNTER — Telehealth: Payer: Self-pay

## 2022-01-13 NOTE — Telephone Encounter (Signed)
Pt called to inform us that her psychiatrist wants to put her on Adderall. Her psychiatrist wants your approval.

## 2022-01-14 NOTE — Telephone Encounter (Signed)
Would recommend better blood pressure and heart rate control.  She has an echo coming up as well. We will discuss it at the next office visit.  Emma Brown [place], DO, [place]

## 2022-01-15 ENCOUNTER — Ambulatory Visit
Admission: RE | Admit: 2022-01-15 | Discharge: 2022-01-15 | Disposition: A | Discharge status: Home / Self Care | Payer: Medicaid Other | Source: Ambulatory Visit | Attending: Cardiology | Admitting: Cardiology

## 2022-01-15 DIAGNOSIS — E119 Type 2 diabetes mellitus without complications: Secondary | ICD-10-CM

## 2022-01-15 NOTE — Telephone Encounter (Signed)
Called pt to explain to her about the message above. Pt understood

## 2022-01-16 LAB — CMP14+EGFR
ALT: 20 IU/L (ref 0–32)
AST: 16 IU/L (ref 0–40)
Albumin/Globulin Ratio: 1.5 (ref 1.2–2.2)
Albumin: 4.4 g/dL (ref 3.9–4.9)
Alkaline Phosphatase: 122 IU/L — ABNORMAL HIGH (ref 44–121)
BUN/Creatinine Ratio: 14 (ref 9–23)
BUN: 12 mg/dL (ref 6–20)
Bilirubin Total: 0.3 mg/dL (ref 0.0–1.2)
CO2: 21 mmol/L (ref 20–29)
Calcium: 11.4 mg/dL — ABNORMAL HIGH (ref 8.7–10.2)
Chloride: 98 mmol/L (ref 96–106)
Creatinine, Ser: 0.86 mg/dL (ref 0.57–1.00)
Globulin, Total: 2.9 g/dL (ref 1.5–4.5)
Glucose: 266 mg/dL — ABNORMAL HIGH (ref 70–99)
Potassium: 4.5 mmol/L (ref 3.5–5.2)
Sodium: 137 mmol/L (ref 134–144)
Total Protein: 7.3 g/dL (ref 6.0–8.5)
eGFR: 89 mL/min/{1.73_m2} (ref >59)

## 2022-01-16 LAB — MICROALBUMIN / CREATININE URINE RATIO
Creatinine, Urine: 114.2 mg/dL
Microalb/Creat Ratio: 12 mg/g creat (ref 0–29)
Microalbumin, Urine: 13.7 ug/mL

## 2022-01-16 LAB — LIPID PANEL WITH LDL/HDL RATIO
Cholesterol, Total: 177 mg/dL (ref 100–199)
HDL: 42 mg/dL (ref >39)
LDL Chol Calc (NIH): 105 mg/dL — ABNORMAL HIGH (ref 0–99)
LDL/HDL Ratio: 2.5 ratio (ref 0.0–3.2)
Triglycerides: 173 mg/dL — ABNORMAL HIGH (ref 0–149)
VLDL Cholesterol Cal: 30 mg/dL (ref 5–40)

## 2022-01-16 LAB — LIPOPROTEIN A (LPA): Lipoprotein (a): 132.4 nmol/L — ABNORMAL HIGH (ref <75.0)

## 2022-01-16 LAB — PRO B NATRIURETIC PEPTIDE: NT-Pro BNP: <36 pg/mL (ref 0–130)

## 2022-01-16 LAB — PREGNANCY, URINE: Preg Test, Ur: NEGATIVE

## 2022-01-16 LAB — LDL CHOLESTEROL, DIRECT: LDL Direct: 104 mg/dL — ABNORMAL HIGH (ref 0–99)

## 2022-01-17 ENCOUNTER — Other Ambulatory Visit: Payer: Self-pay | Admitting: Cardiology

## 2022-01-17 DIAGNOSIS — R0609 Other forms of dyspnea: Secondary | ICD-10-CM

## 2022-01-17 DIAGNOSIS — R9431 Abnormal electrocardiogram [ECG] [EKG]: Secondary | ICD-10-CM

## 2022-01-17 DIAGNOSIS — R072 Precordial pain: Secondary | ICD-10-CM

## 2022-01-17 MED ORDER — METOPROLOL TARTRATE 25 MG PO TABS: 25.0000 mg | ORAL_TABLET | Freq: Two times a day (BID) | ORAL | 0 refills | Status: DC

## 2022-01-17 NOTE — Progress Notes (Signed)
LABORATORY DATA:    Latest Ref Rng & Units 08/17/2021    3:43 PM  CBC  WBC 4.0 - 10.5 K/uL 7.8   Hemoglobin 12.0 - 15.0 g/dL 13.1   Hematocrit 36.0 - 46.0 % 39.9   Platelets 150 - 400 K/uL 411        Latest Ref Rng & Units 01/15/2022   11:35 AM 10/30/2021   10:45 AM 08/17/2021    3:43 PM  CMP  Glucose 70 - 99 mg/dL 266  226  175   BUN 6 - 20 mg/dL '12  14  13   '$ Creatinine 0.57 - 1.00 mg/dL 0.86  0.77  1.06   Sodium 134 - 144 mmol/L 137  136  137   Potassium 3.5 - 5.2 mmol/L 4.5  3.9  3.6   Chloride 96 - 106 mmol/L 98  102  102   CO2 20 - 29 mmol/L '21  27  25   '$ Calcium 8.7 - 10.2 mg/dL 11.4  10.7  9.4   Total Protein 6.0 - 8.5 g/dL 7.3     Total Bilirubin 0.0 - 1.2 mg/dL 0.3     Alkaline Phos 44 - 121 IU/L 122     AST 0 - 40 IU/L 16     ALT 0 - 32 IU/L 20       Lipid Panel     Component Value Date/Time   CHOL 177 01/15/2022 1135   TRIG 173 (H) 01/15/2022 1135   HDL 42 01/15/2022 1135   LDLCALC 105 (H) 01/15/2022 1135   LDLDIRECT 104 (H) 01/15/2022 1135   LABVLDL 30 01/15/2022 1135    No components found for: "NTPROBNP" Recent Labs    01/15/22 1139  PROBNP <36   Recent Labs    07/13/21 1453 08/17/21 1807 10/30/21 1045  TSH 1.16 0.766 0.98    BMP Recent Labs    08/17/21 1543 10/30/21 1045 01/15/22 1135  NA 137 136 137  K 3.6 3.9 4.5  CL 102 102 98  CO2 '25 27 21  '$ GLUCOSE 175* 226* 266*  BUN '13 14 12  '$ CREATININE 1.06* 0.77 0.86  CALCIUM 9.4 10.7* 11.4*  GFRNONAA >60  --   --     HEMOGLOBIN A1C Lab Results  Component Value Date   HGBA1C 9.4 (A) 10/30/2021    Cardiac Panel (last 3 results) No results for input(s): "CKTOTAL", "CKMB", "TROPONINIHS", "RELINDX" in the last 72 hours.  CHOLESTEROL Recent Labs    01/15/22 1135  CHOL 177    Hepatic Function Panel Recent Labs    07/13/21 1453 10/30/21 1045 01/15/22 1135  PROT 6.9  --  7.3  ALBUMIN 4.4 4.3 4.4  AST 14  --  16  ALT 13  --  20  ALKPHOS 111  --  122*  BILITOT 0.4  --   0.3     After initiating chlorthalidone her dyspnea on exertion has improved significantly.  However, she continues to have precordial pain likely noncardiac.  Given her EKG at baseline exercise treadmill stress test is not ideal test of choice.  We discussed both exercise nuclear stress test versus coronary CTA.   The shared decision was to proceed with coronary CTA.  Prescribed Lopressor 25 mg p.o. twice daily to start 2 weeks prior to her coronary CTA.  We will cancel coronary calcium score.  Alkaline phosphatase slightly above normal limits.  And calcium levels are above normal limits.  I have asked her to discuss these findings with  PCP and see if additional work-up is warranted.  Given her diabetes and recent lipid profile recommended statin therapy.  Patient states that she will discuss with PCP.  We will forward a copy of these labs to PCP.  Rex Kras, [place], [facility]  Pager: [phone] Office: [phone]

## 2022-01-17 NOTE — Progress Notes (Signed)
Renal function and electrolytes are stable after starting chlorthalidone.  Given her lipids and non-insulin-dependent diabetes recommended statin therapy.  Patient is to discuss with PCP.  Alkaline phosphatase above normal limits likely nonspecific but I have asked her to discuss it further with PCP as well.  We will discontinue coronary calcium score and schedule her for coronary CTA given her dyspnea and atypical chest pain.  Exercise treadmill stress test is not ideal given the degree of ST-T changes on surface ECG.  We will send in Lopressor 25 mg p.o. twice daily to start 2 weeks prior to her coronary CTA.  Emma Arrants [place], DO, [facility]

## 2022-01-28 ENCOUNTER — Ambulatory Visit: Payer: Medicaid Other

## 2022-01-28 ENCOUNTER — Other Ambulatory Visit: Payer: Medicaid Other

## 2022-01-28 DIAGNOSIS — R0609 Other forms of dyspnea: Secondary | ICD-10-CM

## 2022-02-05 ENCOUNTER — Other Ambulatory Visit: Payer: Medicaid Other

## 2022-02-12 ENCOUNTER — Telehealth (HOSPITAL_COMMUNITY): Payer: Self-pay | Admitting: Emergency Medicine

## 2022-02-12 NOTE — Telephone Encounter (Signed)
Reaching out to patient to offer assistance regarding upcoming cardiac imaging study; pt verbalizes understanding of appt date/time, parking situation and where to check in, pre-test NPO status and medications ordered, and verified current allergies; name and call back number provided for further questions should they arise [name] RN Navigator Cardiac Imaging [facility] [phone] office [phone] cell  Denies iv issues Arrival 930 w/c entrance '25mg'$  metoprolol BID x 2 wks Aware nitro

## 2022-02-15 ENCOUNTER — Ambulatory Visit (HOSPITAL_COMMUNITY)
Admission: RE | Admit: 2022-02-15 | Discharge: 2022-02-15 | Disposition: A | Discharge status: Home / Self Care | Payer: Medicaid Other | Source: Ambulatory Visit | Attending: Cardiology | Admitting: Cardiology

## 2022-02-15 DIAGNOSIS — R0609 Other forms of dyspnea: Secondary | ICD-10-CM | POA: Insufficient documentation

## 2022-02-15 DIAGNOSIS — R072 Precordial pain: Secondary | ICD-10-CM | POA: Diagnosis present

## 2022-02-15 DIAGNOSIS — K760 Fatty (change of) liver, not elsewhere classified: Secondary | ICD-10-CM | POA: Insufficient documentation

## 2022-02-15 MED ORDER — NITROGLYCERIN 0.4 MG SL SUBL
SUBLINGUAL_TABLET | SUBLINGUAL | Status: AC
  Filled 2022-02-15: qty 2

## 2022-02-15 MED ORDER — NITROGLYCERIN 0.4 MG SL SUBL
0.8000 mg | SUBLINGUAL_TABLET | Freq: Once | SUBLINGUAL | Status: AC
  Administered 2022-02-15: 0.8 mg via SUBLINGUAL

## 2022-02-15 MED ORDER — IOHEXOL 350 MG/ML SOLN
100.0000 mL | Freq: Once | INTRAVENOUS | Status: AC | PRN
Start: 2022-02-15 — End: 2022-02-15
  Administered 2022-02-15: 100 mL via INTRAVENOUS

## 2022-02-15 MED ORDER — METOPROLOL TARTRATE 5 MG/5ML IV SOLN
INTRAVENOUS | Status: AC
  Administered 2022-02-15: 5 mg via INTRAVENOUS
  Filled 2022-02-15: qty 15

## 2022-02-15 MED ORDER — DILTIAZEM HCL 25 MG/5ML IV SOLN: 10.0000 mg | Freq: Once | INTRAVENOUS | Status: AC

## 2022-02-15 MED ORDER — DILTIAZEM HCL 25 MG/5ML IV SOLN
INTRAVENOUS | Status: AC
  Administered 2022-02-15: 10 mg via INTRAVENOUS
  Filled 2022-02-15: qty 5

## 2022-02-15 MED ORDER — METOPROLOL TARTRATE 5 MG/5ML IV SOLN: 5.0000 mg | INTRAVENOUS | Status: DC | PRN

## 2022-02-16 ENCOUNTER — Encounter: Payer: Self-pay | Admitting: Cardiology

## 2022-02-16 ENCOUNTER — Ambulatory Visit: Payer: Medicaid Other | Admitting: Cardiology

## 2022-02-16 VITALS — BP 120/83 | HR 79 | Temp 98.8°F | Resp 16 | Ht 67.0 in | Wt 219.0 lb

## 2022-02-16 DIAGNOSIS — I10 Essential (primary) hypertension: Secondary | ICD-10-CM

## 2022-02-16 DIAGNOSIS — R0609 Other forms of dyspnea: Secondary | ICD-10-CM

## 2022-02-16 DIAGNOSIS — R0683 Snoring: Secondary | ICD-10-CM

## 2022-02-16 DIAGNOSIS — E119 Type 2 diabetes mellitus without complications: Secondary | ICD-10-CM

## 2022-02-16 NOTE — Progress Notes (Signed)
ID:  Emma Brown, DOB 01-Jun-1986, MRN [id number]  PCP:  Lucianne Lei, MD  Cardiologist:  Rex Kras, DO, [facility] (established care 01/08/2022)  Date: 02/16/22 Last Office Visit: 01/08/2022  Chief Complaint  Patient presents with   Follow-up    Reevaluation of shortness of breath and discuss test results    HPI  Emma Brown is a 36 y.o. African-American female whose past medical history and cardiovascular risk factors include: HTN, NIDDM Type II, hyperparathyroidism, prior history of preeclampsia during her second pregnancy and eclampsia during her third pregnancy per patient.  Patient was referred to the office for evaluation of CAD given her symptoms and risk factors.  She works as a Audiological scientist at [employer] and has been experiencing shortness of breath approximately 8 to 9 months prior to the office visit.  Patient states that it takes little to no effort before she becomes symptomatic.  Patient was recommended to improve her blood pressures medications were changed and her numbers have improved.  She underwent coronary CTA results reviewed with her in detail and noted below for further reference.  Since last office visit, patient states that her symptoms of shortness of breath has improved by 90% with the medication changes.  From 01/2022 independently reviewed as part of today's office visit.  Coronary CTA results were reviewed including the images with the patient at today's visit.  Her total coronary calcium score is 0 and no evidence of epicardial CAD.    She has had 3 pregnancies and has 3 children.  All of the 3 kids have been premature in the second and third pregnancy were complicated by preeclampsia and eclampsia respectively per patient.  No family history of premature coronary artery disease, sudden cardiac death, cardiomyopathy.  FUNCTIONAL STATUS: No structured exercise program or daily routine.   ALLERGIES: Allergies  Allergen Reactions   Pineapple  Swelling   Shellfish Allergy Shortness Of Breath    MEDICATION LIST PRIOR TO VISIT: Current Meds  Medication Sig   amLODipine (NORVASC) 10 MG tablet Take 10 mg by mouth daily.   buPROPion (WELLBUTRIN XL) 300 MG 24 hr tablet Take 300 mg by mouth daily.   chlorthalidone (HYGROTON) 25 MG tablet Take 1 tablet (25 mg total) by mouth every morning.   ergocalciferol (VITAMIN D2) 1.25 MG (50000 UT) capsule Take 1 capsule (50,000 Units total) by mouth once a week.   glucose blood (ACCU-CHEK GUIDE) test strip 1 each by Other route daily in the afternoon. Use as instructed   ibuprofen (ADVIL) 800 MG tablet Take 1 tablet (800 mg total) by mouth every 8 (eight) hours as needed.   metFORMIN (GLUCOPHAGE) 500 MG tablet Take 500 mg by mouth daily with breakfast.   Semaglutide (RYBELSUS) 3 MG TABS Take 3 mg by mouth once.   valACYclovir (VALTREX) 1000 MG tablet Take 1 tablet (1,000 mg total) by mouth 2 (two) times daily.   valsartan (DIOVAN) 160 MG tablet Take 160 mg by mouth daily.   [DISCONTINUED] metoprolol tartrate (LOPRESSOR) 25 MG tablet Take 1 tablet (25 mg total) by mouth 2 (two) times daily.     PAST MEDICAL HISTORY: Past Medical History:  Diagnosis Date   Arthritis    Depression    Diabetes mellitus without complication ([place])    Dyspnea    Fibroid    Gestational diabetes    Hypertension    Polycystic ovarian disease    Pregnancy induced hypertension    Preterm labor    Vaginal Pap smear,  abnormal     PAST SURGICAL HISTORY: Past Surgical History:  Procedure Laterality Date   CESAREAN SECTION     OVARIAN CYST DRAINAGE     OVARIAN CYST DRAINAGE     WISDOM TOOTH EXTRACTION      FAMILY HISTORY: The patient family history includes Cancer in her maternal grandfather and mother; Diabetes in her father; Heart disease in her father; Hypertension in her brother, brother, father, maternal aunt, maternal grandmother, maternal uncle, and mother.  SOCIAL HISTORY:  The patient  reports that  she has never smoked. She has never been exposed to tobacco smoke. She has never used smokeless tobacco. She reports current alcohol use. She reports that she does not use drugs.  REVIEW OF SYSTEMS: Review of Systems  Cardiovascular:  Positive for dyspnea on exertion (improved by 90%). Negative for chest pain, claudication, irregular heartbeat, leg swelling, near-syncope, orthopnea, palpitations, paroxysmal nocturnal dyspnea and syncope.  Respiratory:  Positive for snoring.   Hematologic/Lymphatic: Negative for bleeding problem.  Musculoskeletal:  Negative for muscle cramps and myalgias.  Neurological:  Negative for dizziness and light-headedness.    PHYSICAL EXAM:    02/16/2022   11:47 AM 02/15/2022   10:44 AM 02/15/2022   10:23 AM  Vitals with BMI  Height 5' 7"     Weight 219 lbs    BMI 44.96    Systolic 759 163 846  Diastolic 83 59 88  Pulse 79 70 71    CONSTITUTIONAL: Well-developed and well-nourished. No acute distress.  SKIN: Skin is warm and dry. No rash noted. No cyanosis. No pallor. No jaundice HEAD: Normocephalic and atraumatic.  EYES: No scleral icterus MOUTH/THROAT: Moist oral membranes.  NECK: No JVD present.  Mild thyromegaly, no carotid bruits  CHEST Normal respiratory effort. No intercostal retractions  LUNGS: Clear to auscultation bilaterally.  No stridor. No wheezes. No rales.  CARDIOVASCULAR: Regular rate and rhythm, positive K5-L9, soft systolic murmur, no rubs or gallops appreciated. ABDOMINAL: Soft, nontender, nondistended, positive bowel sounds in all 4 quadrants, no apparent ascites.  EXTREMITIES: No peripheral edema, warm to touch, 2+ bilateral DP and PT pulses HEMATOLOGIC: No significant bruising NEUROLOGIC: Oriented to person, place, and time. Nonfocal. Normal muscle tone.  PSYCHIATRIC: Normal mood and affect. Normal behavior. Cooperative  CARDIAC DATABASE: EKG: 01/08/2022: NSR, 87 bpm, LVH per voltage criteria, ST-T changes likely secondary to LVH but  inferolateral ischemia cannot be ruled out, nonspecific T wave abnormality.   Echocardiogram: 01/28/2022:  Normal LV systolic function with visual EF 60-65%. Left ventricle cavity is normal in size. Mild concentric hypertrophy of the left ventricle.  Normal global wall motion. Normal diastolic filling pattern, normal LAP. Calculated EF 60%.  Structurally normal tricuspid valve with no regurgitation. No evidence of pulmonary hypertension.  No prior available for comparison.   Coronary CTA: 02/15/2022: Total coronary calcium score of 0. Normal coronary origin with right dominance.  CAD-RADS = 0 No evidence of CAD (0%).  Cannot rule out small patent foramen ovale. Moderate hepatic steatosis.  LABORATORY DATA:    Latest Ref Rng & Units 08/17/2021    3:43 PM  CBC  WBC 4.0 - 10.5 K/uL 7.8   Hemoglobin 12.0 - 15.0 g/dL 13.1   Hematocrit 36.0 - 46.0 % 39.9   Platelets 150 - 400 K/uL 411        Latest Ref Rng & Units 01/15/2022   11:35 AM 10/30/2021   10:45 AM 08/17/2021    3:43 PM  CMP  Glucose 70 - 99 mg/dL 266  226  175   BUN 6 - 20 mg/dL 12  14  13    Creatinine 0.57 - 1.00 mg/dL 0.86  0.77  1.06   Sodium 134 - 144 mmol/L 137  136  137   Potassium 3.5 - 5.2 mmol/L 4.5  3.9  3.6   Chloride 96 - 106 mmol/L 98  102  102   CO2 20 - 29 mmol/L 21  27  25    Calcium 8.7 - 10.2 mg/dL 11.4  10.7  9.4   Total Protein 6.0 - 8.5 g/dL 7.3     Total Bilirubin 0.0 - 1.2 mg/dL 0.3     Alkaline Phos 44 - 121 IU/L 122     AST 0 - 40 IU/L 16     ALT 0 - 32 IU/L 20       Lipid Panel     Component Value Date/Time   CHOL 177 01/15/2022 1135   TRIG 173 (H) 01/15/2022 1135   HDL 42 01/15/2022 1135   LDLCALC 105 (H) 01/15/2022 1135   LDLDIRECT 104 (H) 01/15/2022 1135   LABVLDL 30 01/15/2022 1135    No components found for: "NTPROBNP" Recent Labs    01/15/22 1139  PROBNP <36   Recent Labs    07/13/21 1453 08/17/21 1807 10/30/21 1045  TSH 1.16 0.766 0.98    BMP Recent Labs     08/17/21 1543 10/30/21 1045 01/15/22 1135  NA 137 136 137  K 3.6 3.9 4.5  CL 102 102 98  CO2 25 27 21   GLUCOSE 175* 226* 266*  BUN 13 14 12   CREATININE 1.06* 0.77 0.86  CALCIUM 9.4 10.7* 11.4*  GFRNONAA >60  --   --     HEMOGLOBIN A1C Lab Results  Component Value Date   HGBA1C 9.4 (A) 10/30/2021   External Labs: Collected: 12/28/2019 Total cholesterol 193, triglycerides 282, HDL 42, LDL 103  Collected: 10/30/2021 provided by PCP Sodium 136, potassium 3.9, chloride 102, bicarb 27, BUN 14, creatinine 0.77. eGFR >60 TSH 0.98  IMPRESSION:    ICD-10-CM   1. Dyspnea on exertion  R06.09     2. Non-insulin dependent type 2 diabetes mellitus ([place])  E11.9     3. Benign hypertension  I10 Ambulatory referral to Sleep Studies    4. Snoring  R06.83 Ambulatory referral to Sleep Studies        RECOMMENDATIONS: Emma Brown is a 36 y.o. African-American female whose past medical history and cardiac risk factors include: HTN, NIDDM Type II, hyperparathyroidism, prior history of preeclampsia during her second pregnancy and eclampsia during her third pregnancy per patient.  Dyspnea on exertion Symptoms significantly improved with better blood pressure management.  Continue current medical therapy. Echo results independently reviewed and noted above for further reference. Coronary CTA notes a total CAC of 0 and no evidence of epicardial coronary artery disease. Patient educated on the importance of primary prevention with regards to blood pressure management, lipid management, glycemic control, increasing physical activity as tolerated with a goal of 30 minutes a day 5 days a week, and weight loss. No additional cardiovascular testing warranted at this time.  Non-insulin dependent type 2 diabetes mellitus ([place]) Her random blood glucose levels on recent labs are not well controlled. I have asked her to follow-up with her provider for better glycemic control. Recommended statin  therapy in the past given her history of diabetes since 2017. Patient states that her PCP has sent in a prescription for statins and she is awaiting to  pick them up.   Benign hypertension Office blood pressures are very well controlled. Continue current medical therapy. Patient is made aware that if she decides to get pregnant the medications need to be changed.  She will call the office for further guidance.  Snoring Strongly encouraged her to have sleep study done to evaluate for sleep apnea given her symptoms of snoring, blood pressure requiring multiple antihypertensive agents. Will refer to Dr. Nehemiah Settle.  Patient would like to follow-up on an annual basis.  I have asked her to follow-up after her annual well visit so that her labs can be reviewed.  She is more than welcome to see me sooner if change in clinical status.  FINAL MEDICATION LIST END OF ENCOUNTER: No orders of the defined types were placed in this encounter.   Medications Discontinued During This Encounter  Medication Reason   glipiZIDE (GLUCOTROL) 5 MG tablet    metoprolol tartrate (LOPRESSOR) 25 MG tablet Discontinued by provider     Current Outpatient Medications:    amLODipine (NORVASC) 10 MG tablet, Take 10 mg by mouth daily., Disp: , Rfl:    buPROPion (WELLBUTRIN XL) 300 MG 24 hr tablet, Take 300 mg by mouth daily., Disp: , Rfl:    chlorthalidone (HYGROTON) 25 MG tablet, Take 1 tablet (25 mg total) by mouth every morning., Disp: 90 tablet, Rfl: 0   ergocalciferol (VITAMIN D2) 1.25 MG (50000 UT) capsule, Take 1 capsule (50,000 Units total) by mouth once a week., Disp: 13 capsule, Rfl: 2   glucose blood (ACCU-CHEK GUIDE) test strip, 1 each by Other route daily in the afternoon. Use as instructed, Disp: 100 each, Rfl: 3   ibuprofen (ADVIL) 800 MG tablet, Take 1 tablet (800 mg total) by mouth every 8 (eight) hours as needed., Disp: 30 tablet, Rfl: 5   metFORMIN (GLUCOPHAGE) 500 MG tablet, Take 500 mg by mouth  daily with breakfast., Disp: , Rfl:    Semaglutide (RYBELSUS) 3 MG TABS, Take 3 mg by mouth once., Disp: , Rfl:    valACYclovir (VALTREX) 1000 MG tablet, Take 1 tablet (1,000 mg total) by mouth 2 (two) times daily., Disp: 30 tablet, Rfl: 11   valsartan (DIOVAN) 160 MG tablet, Take 160 mg by mouth daily., Disp: , Rfl:   Orders Placed This Encounter  Procedures   Ambulatory referral to Sleep Studies    There are no Patient Instructions on file for this visit.   --Continue cardiac medications as reconciled in final medication list. --Return in about 1 year (around 02/17/2023), or if symptoms worsen or fail to improve. or sooner if needed. --Continue follow-up with your primary care physician regarding the management of your other chronic comorbid conditions.  Patient's questions and concerns were addressed to her satisfaction. She voices understanding of the instructions provided during this encounter.   This note was created using a voice recognition software as a result there may be grammatical errors inadvertently enclosed that do not reflect the nature of this encounter. Every attempt is made to correct such errors.  Rex Kras, [place], [facility]  Pager: [phone] Office: [phone]

## 2022-03-12 ENCOUNTER — Ambulatory Visit: Payer: Medicaid Other | Admitting: Internal Medicine

## 2022-03-12 ENCOUNTER — Encounter: Payer: Self-pay | Admitting: Internal Medicine

## 2022-03-12 VITALS — BP 126/84 | HR 80 | Ht 67.0 in | Wt 216.0 lb

## 2022-03-12 DIAGNOSIS — E559 Vitamin D deficiency, unspecified: Secondary | ICD-10-CM

## 2022-03-12 DIAGNOSIS — E213 Hyperparathyroidism, unspecified: Secondary | ICD-10-CM

## 2022-03-12 DIAGNOSIS — E1165 Type 2 diabetes mellitus with hyperglycemia: Secondary | ICD-10-CM

## 2022-03-12 DIAGNOSIS — E042 Nontoxic multinodular goiter: Secondary | ICD-10-CM

## 2022-03-12 LAB — POCT GLUCOSE (DEVICE FOR HOME USE): Glucose Fasting, POC: 157 mg/dL — AB (ref 70–99)

## 2022-03-12 LAB — VITAMIN D 25 HYDROXY (VIT D DEFICIENCY, FRACTURES): VITD: 31.42 ng/mL (ref 30.00–100.00)

## 2022-03-12 NOTE — Patient Instructions (Signed)
-   Stay hydrated  - Avoid over the counter calcium tablets but maintain calcium in your food 2-3 servings daily  - Ergocalciferol 50,000 iu weekly    24-Hour Urine Collection  You will be collecting your urine for a 24-hour period of time. Your timer starts with your first urine of the morning (For example - If you first pee at [place], your timer will start at [place]) [place] away your first urine of the morning Collect your urine every time you pee for the next 24 hours STOP your urine collection 24 hours after you started the collection (For example - You would stop at 9AM the day after you started)

## 2022-03-12 NOTE — Progress Notes (Unsigned)
Name: Emma Brown  Age/ Sex: 36 y.o., female   MRN/ DOB: [id number], 1985/11/05     PCP: Lucianne Lei, MD   Reason for Endocrinology Evaluation: Hypercalcemia   Initial Endocrine Consultative Visit: 11/03/2020    PATIENT IDENTIFIER: [name] is a 36 y.o. female with a past medical history of T2DM, and HTN. The patient has followed with Endocrinology clinic since 11/03/2020 for consultative assistance with management of her diabetes.  DIABETIC HISTORY:  Emma Brown was diagnosed with DM in 2017,  Was on insulin during pregnancy 2020 . Her hemoglobin A1c has ranged from 7.9% in 2020, peaking at 10.7% in 12/2019.   On her initial clinic to our office she had an A1c of 8% , she was on Ozempic . She was advised to AVOID conception until her A1c at <7.0 %  . She was also advised to stop Ozempic prior to conception . Metformin was added as it was on her list but she was not taking it.   Her PCP increase her Ozempic which caused GI side effects, this was subsequently switched to [place].  Patient was not taking Jardiance and was complaining of nausea with Trulicity  Patient asked me to manage her diabetes 06/2021, as in the past she has indicated her preference to manage through PCP  By her return 10/2021 she was complaining of recurrent yeast infection and was not taking metformin due to GI side effect with 1 tablet    HYPERCALCEMIA HISTORY:   Emma Brown indicates that she was first diagnosed with hypercalcemia during routine labs with a serum calcium of 11.5 mg/dL in 06/2019. She was on HCTZ at the time   No hx of renal stones, or osteoporosis nor fractures  On her initial visit we stopped HCTZ  DXA low bone density 06/2021    THYROID HISTORY: She was diagnosed with MNG on thyroid ultrasound 06/2021. She is S/P FNA of the left inferior 1.6 cm nodule with atypia of undetermined significance ( Bethesda catergory III), were unable to proceed with Afirma due to  insufficient sample    Repeat FNA of the right inferior nodule in June 0347 revealed follicular lesion of undetermined significance (Bethesda category III) , Afirma : Parathyroid gland     SUBJECTIVE:     Today (03/12/2022): Emma Brown is here for a follow up on hyperparathyroidism, and MNG.   She has been checking glucose twice a day , she did not bring her meter today . She tried Rybelsus through her PCP but than switched to [facility] ,  has been on it for 3 weeks, denies nausea, vomiting or diarrhea   Denies any local neck swelling  Denies tremors  Denies palpitations   Patient has interest in conceiving in the next year She consumes 2-3 servings of calcium daily Ergocalciferol 50, 000 iu daily     HOME DIABETES REGIMEN:  Jardiance 10 mg daily Metformin 500 mg XR daily  Vitamin D 1000 iu daily      METER DOWNLOAD SUMMARY:    DIABETIC COMPLICATIONS: Microvascular complications:   Denies: CKD Last Eye Exam: Completed   Macrovascular complications:   Denies: CAD, CVA, PVD      HISTORY:  Past Medical History:  Past Medical History:  Diagnosis Date  . Arthritis   . Depression   . Diabetes mellitus without complication ([place])   . Dyspnea   . Fibroid   . Gestational diabetes   . Hypertension   . Polycystic ovarian disease   .  Pregnancy induced hypertension   . Preterm labor   . Vaginal Pap smear, abnormal    Past Surgical History:  Past Surgical History:  Procedure Laterality Date  . CESAREAN SECTION    . OVARIAN CYST DRAINAGE    . OVARIAN CYST DRAINAGE    . WISDOM TOOTH EXTRACTION     Social History:  reports that she has never smoked. She has never been exposed to tobacco smoke. She has never used smokeless tobacco. She reports current alcohol use. She reports that she does not use drugs. Family History:  Family History  Problem Relation Age of Onset  . Cancer Mother   . Hypertension Mother   . Heart disease Father   . Hypertension Father    . Diabetes Father   . Hypertension Brother   . Hypertension Brother   . Hypertension Maternal Aunt   . Hypertension Maternal Uncle   . Hypertension Maternal Grandmother   . Cancer Maternal Grandfather      HOME MEDICATIONS: Allergies as of 03/12/2022       Reactions   Pineapple Swelling   Shellfish Allergy Shortness Of Breath        Medication List        Accurate as of March 12, 2022 10:11 AM. If you have any questions, ask your nurse or doctor.          STOP taking these medications    Rybelsus 3 MG Tabs Generic drug: Semaglutide Stopped by: Dorita Sciara, MD   valsartan 160 MG tablet Commonly known as: DIOVAN Stopped by: Dorita Sciara, MD       TAKE these medications    Accu-Chek Guide test strip Generic drug: glucose blood 1 each by Other route daily in the afternoon. Use as instructed   amLODipine 10 MG tablet Commonly known as: NORVASC Take 10 mg by mouth daily.   atorvastatin 20 MG tablet Commonly known as: LIPITOR Take 20 mg by mouth at bedtime.   buPROPion 300 MG 24 hr tablet Commonly known as: WELLBUTRIN XL Take 300 mg by mouth daily.   chlorthalidone 25 MG tablet Commonly known as: HYGROTON Take 1 tablet (25 mg total) by mouth every morning.   ergocalciferol 1.25 MG (50000 UT) capsule Commonly known as: VITAMIN D2 Take 1 capsule (50,000 Units total) by mouth once a week.   ibuprofen 800 MG tablet Commonly known as: ADVIL Take 1 tablet (800 mg total) by mouth every 8 (eight) hours as needed.   metFORMIN 500 MG tablet Commonly known as: GLUCOPHAGE Take 500 mg by mouth daily with breakfast.   Mounjaro 2.5 MG/0.5ML Pen Generic drug: tirzepatide Inject into the skin.   valACYclovir 1000 MG tablet Commonly known as: Valtrex Take 1 tablet (1,000 mg total) by mouth 2 (two) times daily.   valsartan-hydrochlorothiazide 160-12.5 MG tablet Commonly known as: DIOVAN-HCT Take 1 tablet by mouth daily.          OBJECTIVE:   Vital Signs:BP 126/84 (BP Location: Left Arm, Patient Position: Sitting, Cuff Size: Small)   Pulse 80   Ht '5\' 7"'$  (1.702 m)   Wt 216 lb (98 kg)   SpO2 99%   BMI 33.83 kg/m    Wt Readings from Last 3 Encounters:  03/12/22 216 lb (98 kg)  02/16/22 219 lb (99.3 kg)  01/08/22 219 lb (99.3 kg)     Exam: General: Pt appears well and is in NAD  Neck: General: Supple without adenopathy. Thyroid: Thyromegaly noted   Lungs: Clear with  good BS bilat with no rales, rhonchi, or wheezes  Heart: RRR, + systolic murmur  Abdomen: Normoactive bowel sounds, soft, nontender, without masses or organomegaly palpable  Extremities: No pretibial edema.   Neuro: MS is good with appropriate affect, pt is alert and Ox3      DATA REVIEWED:  Lab Results  Component Value Date   HGBA1C 9.4 (A) 10/30/2021   HGBA1C 8.0 (H) 07/13/2021   HGBA1C 7.7 (A) 02/06/2021    Latest Reference Range & Units 03/12/22 10:30  Calcium 8.6 - 10.2 mg/dL 11.2 (H)    Latest Reference Range & Units 03/12/22 10:30  VITD 30.00 - 100.00 ng/mL 31.42    Latest Reference Range & Units 03/12/22 10:30  PTH, Intact 16 - 77 pg/mL 75    03/01/2022 BUN 12 Cr 0.910 GFR 89 A1c 9.1 %    Thyroid ultrasound 07/20/2021 Parenchymal Echotexture: Normal   Isthmus: 0.4 cm   Right lobe: 5.6 x 1.5 x 1.4 cm   Left lobe: 4.8 x 1.0 x 1.9 cm   _________________________________________________________   Estimated total number of nodules >/= 1 cm: 2   Number of spongiform nodules >/=  2 cm not described below (TR1): 0   Number of mixed cystic and solid nodules >/= 1.5 cm not described below (TR2): 0   _________________________________________________________   Nodule labeled 1 is a solid and cystic isoechoic nodule (TR 2) measuring up to 1.0 cm in the mid right thyroid lobe. This nodule does NOT meet TI-RADS criteria for biopsy or dedicated follow-up.   Nodule labeled 2 is a solid very hypoechoic nodule  (TR 4) which appears to be within the most inferior aspect of the right thyroid lobe and measures 1.6 x 1.3 x 0.7 cm. **Given size (>/= 1.5 cm) and appearance, fine needle aspiration of this moderately suspicious nodule should be considered based on TI-RADS criteria.   IMPRESSION: 1. Borderline enlarged multinodular thyroid gland. 2. Nodule labeled 2 in the inferior right thyroid lobe meets criteria for biopsy.     FNA 08/13/2021  Clinical History: Nodule labeled 2 is a solid very hypoechoic nodule  (TR4) which appears to be within the most inferior aspect of the right  thyroid lobe and measures 1.6 x 1.3 x 0.7cm.  Specimen Submitted:  A. THYROID, RIGHT INFERIOR, FINE NEEDLE ASPIRATION:    FINAL MICROSCOPIC DIAGNOSIS:  - Atypia of undetermined significance (Bethesda category III)   Afirma Insufficient     FNA right inferior nodule 11/20/2021  Clinical History: Nodule labeled 2 is a solid very hypoechoic nodule  (TR4) which appears to be within the most inferior aspect of the right  thyroid lobe and measures 1.6 x 1.3 x 0.7 cm. Given size (>/= 1.5 cm)  and appearance, fine needle aspiration of this moderately susp.  Specimen Submitted:  A. THYROID, RIGHT INFERIOR, FINE NEEDLE ASPIRATION:    FINAL MICROSCOPIC DIAGNOSIS:  - Follicular lesion of undetermined significance (Bethesda category III)   AFIRMA : Parathyroid adenoma   ASSESSMENT / PLAN / RECOMMENDATIONS:   1) Primary Hyperparathyroidism :   - Pt meets surgical criteria based on age <60  - I have asked her to proceed with 24 hr urinary calcium excretion that her vitamin d has normalized   Recommendations - Stay hydrated  - Avoid over the counter calcium tablets but maintain calcium in your food 2-3 servings daily      2) Vitamin D deficiency:   -This has finally normalized  Medication Ergocalciferol 50,000 IU weekly  3) Multinodular goiter:  -No local neck symptoms -She is S/P FNA of the left  inferior 1.6 cm nodule with atypia of undetermined significance ( Bethesda catergory III), were unable to proceed with Afirma due to insufficient sample  -Repeat FNA of the right inferior nodule in June 8325 revealed follicular lesion of undetermined significance (Bethesda category III) , Afirma : Parathyroid gland   4)1) Type 2 Diabetes Mellitus, Poorly controlled, Without complications - Most recent A1c of 9.1 %. Goal A1c < 7.0 %.     -We will defer management to PCP  - Counseled about avoiding pregnancy until her A1c 7.0 %  or less, she was also advised not to conceive while on Mounjaro    F/U in 4 months     Signed electronically by: Mack Guise, MD  [facility]  [address] [place], [place] [address] Phone: [phone] FAX: [phone]   CC: Lucianne Lei, [address] [place] [address] Phone: [phone]  Fax: [phone]  Return to Endocrinology clinic as below: Future Appointments  Date Time Provider [place]  03/22/2022  3:30 PM Valora Piccolo Barnabas Lister, RD [place] [facility]

## 2022-03-13 LAB — PTH, INTACT AND CALCIUM
Calcium: 11.2 mg/dL — ABNORMAL HIGH (ref 8.6–10.2)
PTH: 75 pg/mL (ref 16–77)

## 2022-03-15 DIAGNOSIS — R072 Precordial pain: Secondary | ICD-10-CM | POA: Insufficient documentation

## 2022-03-15 DIAGNOSIS — R0609 Other forms of dyspnea: Secondary | ICD-10-CM | POA: Insufficient documentation

## 2022-03-15 NOTE — Addendum Note (Signed)
Encounter addended by: Rex Kras, DO on: 03/15/2022 10:50 PM  Actions taken: Problem List modified, Charge Capture section accepted

## 2022-03-22 ENCOUNTER — Ambulatory Visit: Payer: Medicaid Other | Admitting: Dietician

## 2022-03-22 ENCOUNTER — Encounter: Payer: Self-pay | Admitting: Cardiology

## 2022-03-23 ENCOUNTER — Other Ambulatory Visit: Payer: Medicaid Other

## 2022-03-23 DIAGNOSIS — E213 Hyperparathyroidism, unspecified: Secondary | ICD-10-CM

## 2022-03-23 NOTE — Progress Notes (Unsigned)
Total volume 2,050 started 03-22-2022  6:30 a.m.  ended 03-23-2022 6:30 a.m.

## 2022-03-24 LAB — CALCIUM, URINE, 24 HOUR: Calcium, 24H Urine: 377 mg/24 h — ABNORMAL HIGH

## 2022-03-24 LAB — CREATININE, URINE, 24 HOUR: Creatinine, 24H Ur: 2.13 g/(24.h) (ref 0.50–2.15)

## 2022-03-26 ENCOUNTER — Other Ambulatory Visit: Payer: Self-pay

## 2022-03-26 ENCOUNTER — Encounter: Payer: Self-pay | Admitting: Dietician

## 2022-03-26 ENCOUNTER — Encounter: Payer: Medicaid Other | Attending: Internal Medicine | Admitting: Dietician

## 2022-03-26 DIAGNOSIS — E1165 Type 2 diabetes mellitus with hyperglycemia: Secondary | ICD-10-CM | POA: Insufficient documentation

## 2022-03-26 NOTE — Patient Instructions (Signed)
Be sure to eat Breakfast, Lunch, and Dinner daily. Small, low fat meals are best. Add a protein shake if you not eating adequate meals. Think about the My Plate when planning a meal.  Think nutrition density. We eat for fuel and nutrients.

## 2022-03-26 NOTE — Progress Notes (Signed)
Diabetes Self-Management Education  Visit Type: Follow-up  Appt. Start Time: 0905 Appt. End Time: 0935  03/26/2022  Emma Brown, identified by name and date of birth, is a 36 y.o. female with a diagnosis of Diabetes: Type 2.   ASSESSMENT  Patient is here today alone.  She was last seen by myself on 12/25/2021.  She is no longer working 3rd shift and is able to sleep better and cook more. She started Mounjaro 1 month ago.  She is tolerating well and better than Trulicity and Ozempic. She is not as achy. Fasting glucose 168 They were cooking at home more.  Increased salads. She is more active and takes the children outside 3 times per week. Sleep 8 hours. Stopped sugary drinks.  History includes includes Type 2 diabetes (2017), HTN, constipation, GDM, hyperparathyroidism Labs noted to include:  A1C 9.4% (10/30/2021) increased from 8% (07/13/2021), vitamin D 15, GFR 98, (10/30/2021) Medications:  Metformin, Mounjaro  (Did not tolerate ozempic, Trulicity due to GI side effects), Jardiance (yeast infections), prenatal vitamin, Vitamin D Sleep:  4 hours uninterrupted on work days and 5 hours on days that she is off   Weight hx: 67" 216 lbs 03/26/2022 222 lbs 12/25/2021 219 lbs 10/30/2021 211 lbs 10/13/2021 UBW 210-220 lbs   Patient lives with her husband and 3 children (3,4, and 34 yo).  She does the shopping and share cooking.  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  Her husband just had a MI, her mother just passed.  Stress is high. Husband was just diagnosed with diabetes (type 2 vs type 1) as well. 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 Forgets to take her medications at times.  Weight 216 lb (98 kg). Body mass index is 33.83 kg/m.   Diabetes Self-Management Education - 03/26/22 1656       Visit Information   Visit Type Follow-up      Initial Visit   Diabetes Type Type 2    Date Diagnosed 2017    Are you taking your medications as prescribed? Yes       Psychosocial Assessment   Patient Belief/Attitude about Diabetes Motivated to manage diabetes    Self-care barriers None    Self-management support Doctor's office;Family    Other persons present Patient    Patient Concerns Nutrition/Meal planning;Glycemic Control    Special Needs None    Preferred Learning Style No preference indicated    Learning Readiness Ready      Pre-Education Assessment   Patient understands the diabetes disease and treatment process. Comprehends key points    Patient understands incorporating nutritional management into lifestyle. Needs Review    Patient undertands incorporating physical activity into lifestyle. Demonstrates understanding / competency    Patient understands using medications safely. Comprehends key points    Patient understands monitoring blood glucose, interpreting and using results Comprehends key points    Patient understands prevention, detection, and treatment of acute complications. Comprehends key points    Patient understands prevention, detection, and treatment of chronic complications. Compreheands key points    Patient understands how to develop strategies to address psychosocial issues. Comprehends key points    Patient understands how to develop strategies to promote health/change behavior. Comprehends key points      Complications   How often do you check your blood sugar? 1-2 times/day    Fasting Blood glucose range (mg/dL) 130-179    Number of hypoglycemic episodes per month 0      Dietary  Intake   Breakfast scrambled eggs    Snack (morning) none    Lunch baked potato, sour cream, broccoli, cheese    Snack (afternoon) none    Dinner 2 bites burrito    Snack (evening) none    Beverage(s) water, flavored water, occasional diet soda      Activity / Exercise   Activity / Exercise Type Light (walking / raking leaves)    How many days per week do you exercise? 3    How many minutes per day do you exercise? 45    Total  minutes per week of exercise 135      Patient Education   Previous Diabetes Education Yes (please comment)   12/2021   Healthy Eating Role of diet in the treatment of diabetes and the relationship between the three main macronutrients and blood glucose level;Meal options for control of blood glucose level and chronic complications.    Being Active Role of exercise on diabetes management, blood pressure control and cardiac health.    Medications Reviewed patients medication for diabetes, action, purpose, timing of dose and side effects.    Monitoring Identified appropriate SMBG and/or A1C goals.      Individualized Goals (developed by patient)   Nutrition General guidelines for healthy choices and portions discussed    Physical Activity Exercise 3-5 times per week;45 minutes per day    Medications take my medication as prescribed    Monitoring  Test my blood glucose as discussed    Problem Solving Addressing barriers to behavior change    Reducing Risk examine blood glucose patterns      Patient Self-Evaluation of Goals - Patient rates self as meeting previously set goals (% of time)   Nutrition >75% (most of the time)    Physical Activity >75% (most of the time)    Medications >75% (most of the time)    Monitoring >75% (most of the time)    Problem Solving and behavior change strategies  >75% (most of the time)    Reducing Risk (treating acute and chronic complications) >48% (most of the time)    Health Coping >75% (most of the time)      Post-Education Assessment   Patient understands the diabetes disease and treatment process. Demonstrates understanding / competency    Patient understands incorporating nutritional management into lifestyle. Comprehends key points    Patient undertands incorporating physical activity into lifestyle. Demonstrates understanding / competency    Patient understands using medications safely. Demonstrates understanding / competency    Patient understands  monitoring blood glucose, interpreting and using results Demonstrates understanding / competency    Patient understands prevention, detection, and treatment of acute complications. Demonstrates understanding / competency    Patient understands prevention, detection, and treatment of chronic complications. Demonstrates understanding / competency    Patient understands how to develop strategies to address psychosocial issues. Demonstrates understanding / competency    Patient understands how to develop strategies to promote health/change behavior. Comprehends key points      Outcomes   Expected Outcomes Demonstrated interest in learning. Expect positive outcomes    Future DMSE 3-4 months    Program Status Not Completed      Subsequent Visit   Since your last visit have you continued or begun to take your medications as prescribed? Yes    Since your last visit have you experienced any weight changes? Loss    Weight Loss (lbs) 5    Since your last visit, are you checking  your blood glucose at least once a day? Yes             Individualized Plan for Diabetes Self-Management Training:   Learning Objective:  Patient will have a greater understanding of diabetes self-management. Patient education plan is to attend individual and/or group sessions per assessed needs and concerns.   Plan:   Patient Instructions  Be sure to eat Breakfast, Lunch, and Dinner daily. Small, low fat meals are best. Add a protein shake if you not eating adequate meals. Think about the My Plate when planning a meal.  Think nutrition density. We eat for fuel and nutrients.    Expected Outcomes:  Demonstrated interest in learning. Expect positive outcomes  Education material provided:   If problems or questions, patient to contact team via:  Phone  Future DSME appointment: 3-4 months

## 2022-05-26 ENCOUNTER — Ambulatory Visit: Payer: Self-pay | Admitting: Surgery

## 2022-06-24 NOTE — Patient Instructions (Signed)
SURGICAL WAITING ROOM VISITATION  Patients having surgery or a procedure may have no more than 2 support people in the waiting area - these visitors may rotate.    Children under the age of 74 must have an adult with them who is not the patient.  Due to an increase in RSV and influenza rates and associated hospitalizations, children ages 28 and under may not visit patients in [place].  If the patient needs to stay at the hospital during part of their recovery, the visitor guidelines for inpatient rooms apply. Pre-op nurse will coordinate an appropriate time for 1 support person to accompany patient in pre-op.  This support person may not rotate.    Please refer to the [facility] website for the visitor guidelines for Inpatients (after your surgery is over and you are in a regular room).       Your procedure is scheduled on:  07/09/2022    Report to [facility] Main Entrance    Report to admitting at  [facility] AM   Call this number if you have problems the morning of surgery [phone]   Do not eat food :After Midnight.   After Midnight you may have the following liquids until ___ 0430___ AM  DAY OF SURGERY  Water Non-Citrus Juices (without pulp, NO RED-Apple, White grape, White cranberry) Black Coffee (NO MILK/CREAM OR CREAMERS, sugar ok)  Clear Tea (NO MILK/CREAM OR CREAMERS, sugar ok) regular and decaf                             Plain Jell-O (NO RED)                                           Fruit ices (not with fruit pulp, NO RED)                                     Popsicles (NO RED)                                                               Sports drinks like Gatorade (NO RED)                           If you have questions, please contact your surgeon's office.       Oral Hygiene is also important to reduce your risk of infection.                                    Remember - BRUSH YOUR TEETH THE MORNING OF SURGERY WITH YOUR REGULAR  TOOTHPASTE  DENTURES WILL BE REMOVED PRIOR TO SURGERY PLEASE DO NOT APPLY "Poly grip" OR ADHESIVES!!!   Do NOT smoke after Midnight   Take these medicines the morning of surgery with A SIP OF WATER:   DO NOT TAKE ANY ORAL DIABETIC MEDICATIONS DAY OF YOUR SURGERY  Bring CPAP mask and tubing day of surgery.  You may not have any metal on your body including hair pins, jewelry, and body piercing             Do not wear make-up, lotions, powders, perfumes/cologne, or deodorant  Do not wear nail polish including gel and S&S, artificial/acrylic nails, or any other type of covering on natural nails including finger and toenails. If you have artificial nails, gel coating, etc. that needs to be removed by a nail salon please have this removed prior to surgery or surgery may need to be canceled/ delayed if the surgeon/ anesthesia feels like they are unable to be safely monitored.   Do not shave  48 hours prior to surgery.               Men may shave face and neck.   Do not bring valuables to the hospital. [place].   Contacts, glasses, dentures or bridgework may not be worn into surgery.   Bring small overnight bag day of surgery.   DO NOT [place]. PHARMACY WILL DISPENSE MEDICATIONS LISTED ON YOUR MEDICATION LIST TO YOU DURING YOUR ADMISSION [place]!    Patients discharged on the day of surgery will not be allowed to drive home.  Someone NEEDS to stay with you for the first 24 hours after anesthesia.   Special Instructions: Bring a copy of your healthcare power of attorney and living will documents the day of surgery if you haven't scanned them before.              Please read over the following fact sheets you were given: IF [place] [phone]   If you received a COVID test during your pre-op visit  it is  requested that you wear a mask when out in public, stay away from anyone that may not be feeling well and notify your surgeon if you develop symptoms. If you test positive for Covid or have been in contact with anyone that has tested positive in the last 10 days please notify you surgeon.    [place] - Preparing for Surgery Before surgery, you can play an important role.  Because skin is not sterile, your skin needs to be as free of germs as possible.  You can reduce the number of germs on your skin by washing with CHG (chlorahexidine gluconate) soap before surgery.  CHG is an antiseptic cleaner which kills germs and bonds with the skin to continue killing germs even after washing. Please DO NOT use if you have an allergy to CHG or antibacterial soaps.  If your skin becomes reddened/irritated stop using the CHG and inform your nurse when you arrive at Short Stay. Do not shave (including legs and underarms) for at least 48 hours prior to the first CHG shower.  You may shave your face/neck. Please follow these instructions carefully:  1.  Shower with CHG Soap the night before surgery and the  morning of Surgery.  2.  If you choose to wash your hair, wash your hair first as usual with your  normal  shampoo.  3.  After you shampoo, rinse your hair and body thoroughly to remove the  shampoo.  4.  Use CHG as you would any other liquid soap.  You can apply chg directly  to the skin and wash                       Gently with a scrungie or clean washcloth.  5.  Apply the CHG Soap to your body ONLY FROM THE NECK DOWN.   Do not use on face/ open                           Wound or open sores. Avoid contact with eyes, ears mouth and genitals (private parts).                       Wash face,  Genitals (private parts) with your normal soap.             6.  Wash thoroughly, paying special attention to the area where your surgery  will be performed.  7.  Thoroughly rinse your body with warm  water from the neck down.  8.  DO NOT shower/wash with your normal soap after using and rinsing off  the CHG Soap.                9.  Pat yourself dry with a clean towel.            10.  Wear clean pajamas.            11.  Place clean sheets on your bed the night of your first shower and do not  sleep with pets. Day of Surgery : Do not apply any lotions/deodorants the morning of surgery.  Please wear clean clothes to the hospital/surgery center.  FAILURE TO FOLLOW THESE INSTRUCTIONS MAY RESULT IN THE CANCELLATION OF YOUR SURGERY PATIENT SIGNATURE_________________________________  NURSE SIGNATURE__________________________________  ________________________________________________________________________

## 2022-06-24 NOTE — Progress Notes (Signed)
Anesthesia Review:  PCP: Cardiologist : Chest x-ray : 2v-08/17/21  EKG : 01/08/22  CT cors- 03/15/22  Echo : 02/16/22  Stress test: Cardiac Cath :  Activity level:  Sleep Study/ CPAP : Fasting Blood Sugar :      / Checks Blood Sugar -- times a day:   Blood Thinner/ Instructions /Last Dose: ASA / Instructions/ Last Dose :    DM- type  Hgba1c-

## 2022-06-25 ENCOUNTER — Ambulatory Visit: Payer: Medicaid Other | Admitting: Dietician

## 2022-06-29 ENCOUNTER — Encounter (HOSPITAL_COMMUNITY)
Admission: RE | Admit: 2022-06-29 | Discharge: 2022-06-29 | Disposition: A | Discharge status: Home / Self Care | Payer: Medicaid Other | Source: Ambulatory Visit | Attending: Family Medicine | Admitting: Family Medicine

## 2022-06-30 NOTE — Patient Instructions (Signed)
DUE TO COVID-19 ONLY TWO VISITORS  (aged 38 and older)  ARE ALLOWED TO COME WITH YOU AND STAY IN THE WAITING ROOM ONLY DURING PRE OP AND PROCEDURE.   **NO VISITORS ARE ALLOWED IN THE SHORT STAY AREA OR RECOVERY ROOM!!**  IF YOU WILL BE ADMITTED INTO THE HOSPITAL YOU ARE ALLOWED ONLY FOUR SUPPORT PEOPLE DURING VISITATION HOURS ONLY (7 AM -8PM)   The support person(s) must pass our screening, gel in and out, and wear a mask at all times, including in the patient's room. Patients must also wear a mask when staff or their support person are in the room. Visitors GUEST BADGE MUST BE WORN VISIBLY  One adult visitor may remain with you overnight and MUST be in the room by 8 P.M.     Your procedure is scheduled on: 07/09/22   Report to [facility] Main Entrance    Report to admitting at : 5:30 AM   Call this number if you have problems the morning of surgery [phone]   Do not eat food :After Midnight.   After Midnight you may have the following liquids until : 4:30 AM DAY OF SURGERY  Water Black Coffee (sugar ok, NO MILK/CREAM OR CREAMERS)  Tea (sugar ok, NO MILK/CREAM OR CREAMERS) regular and decaf                             Plain Jell-O (NO RED)                                           Fruit ices (not with fruit pulp, NO RED)                                     Popsicles (NO RED)                                                                  Juice: apple, WHITE grape, WHITE cranberry Sports drinks like Gatorade (NO RED)             Oral Hygiene is also important to reduce your risk of infection.                                    Remember - BRUSH YOUR TEETH THE MORNING OF SURGERY WITH YOUR REGULAR TOOTHPASTE  DENTURES WILL BE REMOVED PRIOR TO SURGERY PLEASE DO NOT APPLY "Poly grip" OR ADHESIVES!!!   Do NOT smoke after Midnight   Take these medicines the morning of surgery with A SIP OF WATER: bupropion,metoprolol,amlodipine How to Manage Your Diabetes Before and  After Surgery  Why is it important to control my blood sugar before and after surgery? Improving blood sugar levels before and after surgery helps healing and can limit problems. A way of improving blood sugar control is eating a healthy diet by:  Eating less sugar and carbohydrates  Increasing activity/exercise  Talking with your doctor about reaching your blood sugar goals  High blood sugars (greater than 180 mg/dL) can raise your risk of infections and slow your recovery, so you will need to focus on controlling your diabetes during the weeks before surgery. Make sure that the doctor who takes care of your diabetes knows about your planned surgery including the date and location.  How do I manage my blood sugar before surgery? Check your blood sugar at least 4 times a day, starting 2 days before surgery, to make sure that the level is not too high or low. Check your blood sugar the morning of your surgery when you wake up and every 2 hours until you get to the Short Stay unit. If your blood sugar is less than 70 mg/dL, you will need to treat for low blood sugar: Do not take insulin. Treat a low blood sugar (less than 70 mg/dL) with  cup of clear juice (cranberry or apple), 4 glucose tablets, OR glucose gel. Recheck blood sugar in 15 minutes after treatment (to make sure it is greater than 70 mg/dL). If your blood sugar is not greater than 70 mg/dL on recheck, call [phone] for further instructions. Report your blood sugar to the short stay nurse when you get to Short Stay.  If you are admitted to the hospital after surgery: Your blood sugar will be checked by the staff and you will probably be given insulin after surgery (instead of oral diabetes medicines) to make sure you have good blood sugar levels. The goal for blood sugar control after surgery is 80-180 mg/dL.   WHAT DO I DO ABOUT MY DIABETES MEDICATION?  Do not take oral diabetes medicines (pills) the morning of  surgery.  THE DAY BEFORE SURGERY, take metformin as usual.      THE MORNING OF SURGERY, DO NOT TAKE ANY ORAL DIABETIC MEDICATIONS DAY OF YOUR SURGERY  DO NOT TAKE THE FOLLOWING 7 DAYS PRIOR TO SURGERY: Ozempic, Wegovy, Rybelsus (Semaglutide), Byetta (exenatide), Bydureon (exenatide ER), Victoza, Saxenda (liraglutide), or Trulicity (dulaglutide) Mounjaro (Tirzepatide) Adlyxin (Lixisenatide), Polyethylene Glycol Loxenatide.  Bring CPAP mask and tubing day of surgery.                              You may not have any metal on your body including hair pins, jewelry, and body piercing             Do not wear make-up, lotions, powders, perfumes/cologne, or deodorant  Do not wear nail polish including gel and S&S, artificial/acrylic nails, or any other type of covering on natural nails including finger and toenails. If you have artificial nails, gel coating, etc. that needs to be removed by a nail salon please have this removed prior to surgery or surgery may need to be canceled/ delayed if the surgeon/ anesthesia feels like they are unable to be safely monitored.   Do not shave  48 hours prior to surgery.               Men may shave face and neck.   Do not bring valuables to the hospital. [place].   Contacts, glasses, or bridgework may not be worn into surgery.   Bring small overnight bag day of surgery.   DO NOT [place]. PHARMACY WILL DISPENSE MEDICATIONS LISTED ON YOUR MEDICATION LIST TO YOU DURING YOUR ADMISSION  [place]!    Patients discharged on the day of surgery will not be allowed to drive home.  Someone NEEDS to stay with you for the first 24 hours after anesthesia.   Special Instructions: Bring a copy of your healthcare power of attorney and living will documents         the day of surgery if you haven't scanned them before.              Please read over the following fact sheets you  were given: IF YOU HAVE QUESTIONS ABOUT YOUR PRE-OP INSTRUCTIONS PLEASE CALL [phone]    [facility] - Preparing for Surgery Before surgery, you can play an important role.  Because skin is not sterile, your skin needs to be as free of germs as possible.  You can reduce the number of germs on your skin by washing with CHG (chlorahexidine gluconate) soap before surgery.  CHG is an antiseptic cleaner which kills germs and bonds with the skin to continue killing germs even after washing. Please DO NOT use if you have an allergy to CHG or antibacterial soaps.  If your skin becomes reddened/irritated stop using the CHG and inform your nurse when you arrive at Short Stay. Do not shave (including legs and underarms) for at least 48 hours prior to the first CHG shower.  You may shave your face/neck. Please follow these instructions carefully:  1.  Shower with CHG Soap the night before surgery and the  morning of Surgery.  2.  If you choose to wash your hair, wash your hair first as usual with your  normal  shampoo.  3.  After you shampoo, rinse your hair and body thoroughly to remove the  shampoo.                           4.  Use CHG as you would any other liquid soap.  You can apply chg directly  to the skin and wash                       Gently with a scrungie or clean washcloth.  5.  Apply the CHG Soap to your body ONLY FROM THE NECK DOWN.   Do not use on face/ open                           Wound or open sores. Avoid contact with eyes, ears mouth and genitals (private parts).                       Wash face,  Genitals (private parts) with your normal soap.             6.  Wash thoroughly, paying special attention to the area where your surgery  will be performed.  7.  Thoroughly rinse your body with warm water from the neck down.  8.  DO NOT shower/wash with your normal soap after using and rinsing off  the CHG Soap.                9.  Pat yourself dry with a clean towel.            10.  Wear  clean pajamas.            11.  Place clean sheets on your bed the night of your first shower and  do not  sleep with pets. Day of Surgery : Do not apply any lotions/deodorants the morning of surgery.  Please wear clean clothes to the hospital/surgery center.  FAILURE TO FOLLOW THESE INSTRUCTIONS MAY RESULT IN THE CANCELLATION OF YOUR SURGERY PATIENT SIGNATURE_________________________________  NURSE SIGNATURE__________________________________  ________________________________________________________________________

## 2022-07-01 ENCOUNTER — Encounter (HOSPITAL_COMMUNITY): Payer: Self-pay | Admitting: *Deleted

## 2022-07-01 ENCOUNTER — Encounter (HOSPITAL_COMMUNITY)
Admission: RE | Admit: 2022-07-01 | Discharge: 2022-07-01 | Disposition: A | Discharge status: Home / Self Care | Payer: Medicaid Other | Source: Ambulatory Visit | Attending: Surgery | Admitting: Surgery

## 2022-07-01 ENCOUNTER — Other Ambulatory Visit: Payer: Self-pay

## 2022-07-01 VITALS — BP 112/71 | HR 86 | Temp 98.3°F | Ht 67.5 in | Wt 208.0 lb

## 2022-07-01 DIAGNOSIS — E1165 Type 2 diabetes mellitus with hyperglycemia: Secondary | ICD-10-CM | POA: Insufficient documentation

## 2022-07-01 DIAGNOSIS — Z01812 Encounter for preprocedural laboratory examination: Secondary | ICD-10-CM | POA: Insufficient documentation

## 2022-07-01 DIAGNOSIS — Z01818 Encounter for other preprocedural examination: Secondary | ICD-10-CM

## 2022-07-01 HISTORY — DX: Abnormal electrocardiogram (ECG) (EKG): R94.31

## 2022-07-01 HISTORY — DX: Anxiety disorder, unspecified: F41.9

## 2022-07-01 HISTORY — DX: Cardiac murmur, unspecified: R01.1

## 2022-07-01 LAB — BASIC METABOLIC PANEL
Anion gap: 6 (ref 5–15)
BUN: 14 mg/dL (ref 6–20)
CO2: 26 mmol/L (ref 22–32)
Calcium: 10.5 mg/dL — ABNORMAL HIGH (ref 8.9–10.3)
Chloride: 104 mmol/L (ref 98–111)
Creatinine, Ser: 0.81 mg/dL (ref 0.44–1.00)
GFR, Estimated: >60 mL/min (ref >60)
Glucose, Bld: 110 mg/dL — ABNORMAL HIGH (ref 70–99)
Potassium: 3.9 mmol/L (ref 3.5–5.1)
Sodium: 136 mmol/L (ref 135–145)

## 2022-07-01 LAB — CBC
HCT: 37.3 % (ref 36.0–46.0)
Hemoglobin: 12.3 g/dL (ref 12.0–15.0)
MCH: 28.2 pg (ref 26.0–34.0)
MCHC: 33 g/dL (ref 30.0–36.0)
MCV: 85.6 fL (ref 80.0–100.0)
Platelets: 451 10*3/uL — ABNORMAL HIGH (ref 150–400)
RBC: 4.36 MIL/uL (ref 3.87–5.11)
RDW: 12.5 % (ref 11.5–15.5)
WBC: 6.9 10*3/uL (ref 4.0–10.5)
nRBC: 0 % (ref 0.0–0.2)

## 2022-07-01 LAB — GLUCOSE, CAPILLARY: Glucose-Capillary: 115 mg/dL — ABNORMAL HIGH (ref 70–99)

## 2022-07-01 NOTE — Progress Notes (Addendum)
For Short Stay: [place] appointment date:  Bowel Prep reminder:   For Anesthesia: PCP - Dr. Lucianne Lei Cardiologist - DO: Rex Kras. LOV: 02/16/22  Chest x-ray - 08/17/21 EKG - 01/08/22 Stress Test -  ECHO - 01/28/22 Cardiac Cath -  Pacemaker/ICD device last checked: Pacemaker orders received: Device Rep notified:  Spinal Cord Stimulator:  Sleep Study - Yes CPAP - Yes  Fasting Blood Sugar - 150's Checks Blood Sugar __3___ times a week Date and result of last Hgb A1c-  Last dose of GLP1 agonist- monjaro GLP1 instructions: Last dose 06/27/22  Last dose of SGLT-2 inhibitors-  SGLT-2 instructions:   Blood Thinner Instructions: Aspirin Instructions: Last Dose:  Activity level: Can go up a flight of stairs and activities of daily living without stopping and without chest pain and/or shortness of breath   Able to exercise without chest pain and/or shortness of breath  Anesthesia review: Hx: HTN,DIA,CAD,Heart murmur.  Patient denies shortness of breath, fever, cough and chest pain at PAT appointment   Patient verbalized understanding of instructions that were given to them at the PAT appointment. Patient was also instructed that they will need to review over the PAT instructions again at home before surgery.

## 2022-07-02 LAB — HEMOGLOBIN A1C
Hgb A1c MFr Bld: 9.1 % — ABNORMAL HIGH (ref 4.8–5.6)
Mean Plasma Glucose: 214 mg/dL

## 2022-07-02 NOTE — Progress Notes (Signed)
Lab. Results: A1C: 9.1

## 2022-07-03 ENCOUNTER — Encounter (HOSPITAL_COMMUNITY): Payer: Self-pay | Admitting: Surgery

## 2022-07-03 DIAGNOSIS — E210 Primary hyperparathyroidism: Secondary | ICD-10-CM | POA: Diagnosis present

## 2022-07-03 NOTE — H&P (Signed)
REFERRING PHYSICIAN: Shamleffer, Ibtehal  PROVIDER: Odie Edmonds Charlotta Newton, MD   Chief Complaint: New Consultation (Primary hyperparathyroidism)  History of Present Illness:  Patient is referred by Dr. Vivia Ewing for surgical evaluation and management of primary hyperparathyroidism. Patient was first noted to have hypercalcemia approximately 4 years ago. Patient had been taking a thiazide diuretic and this has been discontinued. Patient was also noted to have vitamin D deficiency and this has been successfully treated. However, recent laboratories continue to show a unsuppressed PTH level of 75 with a history of an elevated PTH level as high as 141. Calcium levels have been as high as 11.4 and are currently at 11.2. Vitamin D is now normal at 31.42. 24-hour urine collection for calcium was elevated at 377. Patient underwent an ultrasound of the neck in January 2023. Patient was noted to have a hypoechoic nodule at the inferior aspect of the right thyroid lobe measuring 1.6 cm. She underwent fine-needle aspiration biopsy with insufficient material for diagnosis. Patient underwent a repeat biopsy in June 2023. This showed follicular cells with atypia. Molecular genetic analysis, AFIRMA, confirmed parathyroid tissue. Patient is now referred for surgery for management of a right inferior parathyroid adenoma. Patient has no prior history of head or neck surgery. There is no family history of parathyroid disease. 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  Review of Systems: A complete review of systems was obtained from the patient. I have reviewed this information and discussed as appropriate with the patient. See HPI as well for other ROS.  Review of Systems  Constitutional: Positive for malaise/fatigue and weight loss.  HENT: Negative.  Eyes: Negative.  Respiratory: Negative.  Cardiovascular: Negative.  Gastrointestinal: Negative.   Genitourinary: Negative.  Musculoskeletal: Positive for joint pain.  Skin:  Brittle nails  Neurological: Negative.  Endo/Heme/Allergies: Negative.  Psychiatric/Behavioral: Negative.    Medical History: Past Medical History:  Diagnosis Date  Anxiety  Diabetes mellitus type 2, uncomplicated (CMS-HCC)  Hyperlipidemia  Hypertension  Sleep apnea   Patient Active Problem List  Diagnosis  Encounter for screening for congenital cardiac abnormalities in fetus  Primary hyperparathyroidism (CMS-HCC)   Past Surgical History:  Procedure Laterality Date  CESAREAN SECTION  LAPAROSCOPIC OVARIAN CYSTECTOMY    No Known Allergies  Current Outpatient Medications on File Prior to Visit  Medication Sig Dispense Refill  amLODIPine (NORVASC) 10 MG tablet Take 10 mg by mouth once daily  atorvastatin (LIPITOR) 20 MG tablet Take 20 mg by mouth at bedtime  buPROPion (WELLBUTRIN XL) 300 MG XL tablet Take by mouth  chlorthalidone 25 MG tablet Take 25 mg by mouth every morning  MOUNJARO 2.5 mg/0.5 mL PnIj Inject subcutaneously  insulin NPH (HUMULIN N NPH U-100 INSULIN) injection (concentration 100 units/mL) Inject subcutaneously every morning  insulin regular, human (HUMULIN R REGULAR U-100 INSULN INJ) Inject as directed  labetaloL (TRANDATE) 200 MG tablet Take 200 mg by mouth 2 (two) times daily  metFORMIN (GLUCOPHAGE-XR) 500 MG XR tablet Take 500 mg by mouth daily with breakfast  niFEdipine (PROCARDIA-XL) 60 MG (OSM) XL tablet TK 1 T PO QD 3  prenatal vits62/FA/om3/dha/epa (PRENATAL GUMMY ORAL) Take 1 tablet by mouth once daily  valsartan (DIOVAN) 160 MG tablet Take 160 mg by mouth once daily   No current facility-administered medications on file prior to visit.   Family History  Problem Relation Age of Onset  Hyperlipidemia (Elevated cholesterol) Mother  Diabetes Mother  High blood pressure (Hypertension)  Father  Hyperlipidemia (Elevated cholesterol) Father  Diabetes Father  Obesity  Sister  Diabetes Sister  Obesity Brother  High blood pressure (Hypertension) Brother  Hyperlipidemia (Elevated cholesterol) Brother  Diabetes Brother  Irregular Heart Beat (Arrhythmia) Neg Hx  Congenital heart disease Neg Hx  Sudden death (unexpected death due to unknown cause) Neg Hx  Fainting (Syncope-sudden loss of consciousness) Neg Hx  Pacemaker Neg Hx    Social History   Tobacco Use  Smoking Status Never  Smokeless Tobacco Never    Social History   Socioeconomic History  Marital status: Married  Tobacco Use  Smoking status: Never  Smokeless tobacco: Never  Substance and Sexual Activity  Alcohol use: Never  Drug use: Never  Social History Narrative  Lives with family in [place]. Has one daughter.   Objective:   Vitals:  BP: 100/80  Pulse: 103  Temp: 36.7 C (98 F)  SpO2: 98%  Weight: 100.2 kg (221 lb)  Height: 172.7 cm ('5\' 8"'$ )   Body mass index is 33.6 kg/m.  Physical Exam   GENERAL APPEARANCE Comfortable, no acute issues Development: normal Gross deformities: none  SKIN Rash, lesions, ulcers: none Induration, erythema: none Nodules: none palpable  EYES Conjunctiva and lids: normal Pupils: equal and reactive  EARS, NOSE, MOUTH, THROAT External ears: no lesion or deformity External nose: no lesion or deformity Hearing: grossly normal  NECK Symmetric: yes Trachea: midline Thyroid: no palpable nodules in the thyroid bed  CHEST Respiratory effort: normal Retraction or accessory muscle use: no Breath sounds: normal bilaterally Rales, rhonchi, wheeze: none  CARDIOVASCULAR Auscultation: regular rhythm, normal rate Murmurs: none Pulses: radial pulse 2+ palpable Lower extremity edema: none  ABDOMEN Not assessed  GENITOURINARY/RECTAL Not assessed  MUSCULOSKELETAL Station and gait: normal Digits and nails: no clubbing or cyanosis Muscle strength: grossly normal all extremities Range of motion: grossly normal all  extremities Deformity: none  LYMPHATIC Cervical: none palpable Supraclavicular: none palpable  PSYCHIATRIC Oriented to person, place, and time: yes Mood and affect: normal for situation Judgment and insight: appropriate for situation   Assessment and Plan:   Primary hyperparathyroidism (CMS-HCC)  Patient is referred by her endocrinologist, Dr. Vivia Ewing, for surgical evaluation and management of primary hyperparathyroidism.  Patient provided with a copy of "Parathyroid Surgery: Treatment for Your Parathyroid Gland Problem", published by [employer], 12 pages. Book reviewed and explained to patient during visit today.  Today we reviewed her clinical history. We reviewed her imaging studies and her laboratory studies. It appears that she has a right inferior parathyroid adenoma both by ultrasound criteria and on fine-needle aspiration biopsy with molecular genetic analysis. Today we discussed proceeding with minimally invasive parathyroidectomy. We discussed the fact that there is a greater than a 95% chance of single gland disease. We discussed the risk and benefits of surgery including the risk of recurrent laryngeal nerve injury being less than 1%. We discussed the size and location of the surgical incision. We discussed her postoperative recovery and follow-up. The patient understands and wishes to proceed with surgery in the near future.   Armandina Gemma, MD [facility] [place] practice Office: [phone]

## 2022-07-06 NOTE — Progress Notes (Addendum)
Anesthesia Chart Review:   Case: [id number] Date/Time: 07/09/22 0715   Procedure: RIGHT INFERIOR PARATHYROIDECTOMY (Right)   Anesthesia type: General   Pre-op diagnosis: PRIMARY HYPERPARATHYROIDISM   Location: [facility] ROOM 01 / [facility]   Surgeons: Armandina Gemma, MD       DISCUSSION: Pt is 37 years old with hx HTN, DM, hyperparathyroidism, OSA  Takes Mounjaro, last dose 06/27/22   VS: BP 112/71   Pulse 86   Temp 36.8 C (Oral)   Ht 5' 7.5" (1.715 m)   Wt 94.3 kg   LMP 06/12/2022   SpO2 100%   BMI 32.10 kg/m   PROVIDERS: - PCP is Lucianne Lei, MD - Cardiologist is Rex Kras, DO; pt was referred to cardiology for SOB which improved with improved BP control. Last office visit 02/16/22   LABS: Labs reviewed: Acceptable for surgery. - A1c 9.1; glucose 110. Dr. Harlow Asa is aware, has reviewed labs.   (all labs ordered are listed, but only abnormal results are displayed)  Labs Reviewed  HEMOGLOBIN A1C - Abnormal; Notable for the following components:      Result Value   Hgb A1c MFr Bld 9.1 (*)    All other components within normal limits  BASIC METABOLIC PANEL - Abnormal; Notable for the following components:   Glucose, Bld 110 (*)    Calcium 10.5 (*)    All other components within normal limits  CBC - Abnormal; Notable for the following components:   Platelets 451 (*)    All other components within normal limits  GLUCOSE, CAPILLARY - Abnormal; Notable for the following components:   Glucose-Capillary 115 (*)    All other components within normal limits     IMAGES: CT coronary morphology overread by radiologist 02/15/22:  1. Moderate hepatic steatosis. 2. No acute findings in the chest.  CXR 08/17/21:  - Minor basilar atelectasis. No other acute process by plain radiography.   EKG 01/08/22: NSR, 87 bpm, LVH per voltage criteria, ST-T changes likely secondary to LVH but inferolateral ischemia cannot be ruled out, nonspecific T wave abnormality.    CV: CT coronary  morphology 02/15/22:  1. Total coronary calcium score of 0. 2. Normal coronary origin with right dominance. 3. CAD-RADS = 0 No evidence of CAD (0%). 4. Cannot rule out small patent foramen ovale. RECOMMENDATIONS: Consider non-atherosclerotic causes of chest pain.  Echo 01/28/22:  - Normal LV systolic function with visual EF 60-65%. Left ventricle cavity is normal in size. - Mild concentric hypertrophy of the left ventricle.  - Normal global wall motion. Normal diastolic filling pattern, normal LAP.  - Calculated EF 60%.  - Structurally normal tricuspid valve with no regurgitation. No evidence of pulmonary hypertension.  - No prior available for comparison.    Past Medical History:  Diagnosis Date   Anxiety    Arthritis    Depression    Diabetes mellitus without complication ([place])    Dyspnea    Fibroid    Gestational diabetes    Heart murmur    Hypertension    Polycystic ovarian disease    Pregnancy induced hypertension    Preterm labor    ST elevation    Vaginal Pap smear, abnormal    pre-cancer cells on cervix    Past Surgical History:  Procedure Laterality Date   CERVICAL BIOPSY  W/ LOOP ELECTRODE EXCISION     CESAREAN SECTION     OVARIAN CYST DRAINAGE     OVARIAN CYST DRAINAGE     WISDOM TOOTH  EXTRACTION      MEDICATIONS:  amLODipine (NORVASC) 10 MG tablet   atorvastatin (LIPITOR) 20 MG tablet   buPROPion (WELLBUTRIN XL) 300 MG 24 hr tablet   chlorthalidone (HYGROTON) 25 MG tablet   ergocalciferol (VITAMIN D2) 1.25 MG (50000 UT) capsule   glucose blood (ACCU-CHEK GUIDE) test strip   ibuprofen (ADVIL) 800 MG tablet   lisdexamfetamine (VYVANSE) 30 MG capsule   metFORMIN (GLUCOPHAGE) 500 MG tablet   metoprolol tartrate (LOPRESSOR) 25 MG tablet   tirzepatide (MOUNJARO) 7.5 MG/0.5ML Pen   valACYclovir (VALTREX) 1000 MG tablet   valsartan (DIOVAN) 160 MG tablet   No current facility-administered medications for this encounter.    If no changes, I anticipate pt  can proceed with surgery as scheduled.   Willeen Cass, PhD, FNP-BC [facility]/Anesthesiology Phone: [phone] 07/06/2022 11:30 AM

## 2022-07-06 NOTE — Anesthesia Preprocedure Evaluation (Addendum)
Anesthesia Evaluation  Patient identified by MRN, date of birth, ID band Patient awake    Reviewed: Allergy & Precautions, NPO status , Patient's Chart, lab work & pertinent test results  Airway Mallampati: II  TM Distance: >3 FB Neck ROM: Full    Dental  (+) Dental Advisory Given, Teeth Intact   Pulmonary shortness of breath   Pulmonary exam normal breath sounds clear to auscultation       Cardiovascular hypertension, Pt. on medications and Pt. on home beta blockers Normal cardiovascular exam+ Valvular Problems/Murmurs  Rhythm:Regular Rate:Normal  Echo 01/28/22:  - Normal LV systolic function with visual EF 60-65%. Left ventricle cavity is normal in size. - Mild concentric hypertrophy of the left ventricle.  - Normal global wall motion. Normal diastolic filling pattern, normal LAP.  - Calculated EF 60%.  - Structurally normal tricuspid valve with no regurgitation. No evidence of pulmonary hypertension.  - No prior available for comparison.     Neuro/Psych  PSYCHIATRIC DISORDERS Anxiety Depression    negative neurological ROS     GI/Hepatic negative GI ROS, Neg liver ROS,,,  Endo/Other  diabetes    Renal/GU negative Renal ROS     Musculoskeletal  (+) Arthritis ,    Abdominal  (+) + obese  Peds  Hematology negative hematology ROS (+)   Anesthesia Other Findings   Reproductive/Obstetrics                             Anesthesia Physical Anesthesia Plan  ASA: 3  Anesthesia Plan: General   Post-op Pain Management: Tylenol PO (pre-op)* and Minimal or no pain anticipated   Induction:   PONV Risk Score and Plan: 4 or greater and Ondansetron, Treatment may vary due to age or medical condition, Midazolam and Dexamethasone  Airway Management Planned: Oral ETT  Additional Equipment:   Intra-op Plan:   Post-operative Plan: Extubation in OR  Informed Consent: I have reviewed the patients  History and Physical, chart, labs and discussed the procedure including the risks, benefits and alternatives for the proposed anesthesia with the patient or authorized representative who has indicated his/her understanding and acceptance.     Dental advisory given  Plan Discussed with: CRNA  Anesthesia Plan Comments: (See APP note by Durel Salts, FNP )       Anesthesia Quick Evaluation

## 2022-07-09 ENCOUNTER — Ambulatory Visit (HOSPITAL_COMMUNITY): Payer: Medicaid Other | Admitting: Emergency Medicine

## 2022-07-09 ENCOUNTER — Ambulatory Visit (HOSPITAL_COMMUNITY)
Admission: RE | Admit: 2022-07-09 | Discharge: 2022-07-09 | Disposition: A | Discharge status: Home / Self Care | Payer: Medicaid Other | Source: Ambulatory Visit | Attending: Surgery | Admitting: Surgery

## 2022-07-09 ENCOUNTER — Encounter (HOSPITAL_COMMUNITY): Payer: Self-pay | Admitting: Surgery

## 2022-07-09 ENCOUNTER — Ambulatory Visit (HOSPITAL_BASED_OUTPATIENT_CLINIC_OR_DEPARTMENT_OTHER): Payer: Medicaid Other | Admitting: Anesthesiology

## 2022-07-09 ENCOUNTER — Other Ambulatory Visit: Payer: Self-pay

## 2022-07-09 ENCOUNTER — Encounter (HOSPITAL_COMMUNITY)
Admission: RE | Disposition: A | Discharge status: Home / Self Care | Payer: Self-pay | Source: Ambulatory Visit | Attending: Surgery

## 2022-07-09 DIAGNOSIS — F419 Anxiety disorder, unspecified: Secondary | ICD-10-CM | POA: Insufficient documentation

## 2022-07-09 DIAGNOSIS — Z7984 Long term (current) use of oral hypoglycemic drugs: Secondary | ICD-10-CM

## 2022-07-09 DIAGNOSIS — Z794 Long term (current) use of insulin: Secondary | ICD-10-CM | POA: Insufficient documentation

## 2022-07-09 DIAGNOSIS — I119 Hypertensive heart disease without heart failure: Secondary | ICD-10-CM

## 2022-07-09 DIAGNOSIS — Z01818 Encounter for other preprocedural examination: Secondary | ICD-10-CM

## 2022-07-09 DIAGNOSIS — E119 Type 2 diabetes mellitus without complications: Secondary | ICD-10-CM | POA: Diagnosis not present

## 2022-07-09 DIAGNOSIS — G4730 Sleep apnea, unspecified: Secondary | ICD-10-CM | POA: Diagnosis not present

## 2022-07-09 DIAGNOSIS — E785 Hyperlipidemia, unspecified: Secondary | ICD-10-CM | POA: Insufficient documentation

## 2022-07-09 DIAGNOSIS — E559 Vitamin D deficiency, unspecified: Secondary | ICD-10-CM | POA: Insufficient documentation

## 2022-07-09 DIAGNOSIS — Z79899 Other long term (current) drug therapy: Secondary | ICD-10-CM | POA: Diagnosis not present

## 2022-07-09 DIAGNOSIS — E210 Primary hyperparathyroidism: Secondary | ICD-10-CM | POA: Diagnosis not present

## 2022-07-09 DIAGNOSIS — M1990 Unspecified osteoarthritis, unspecified site: Secondary | ICD-10-CM | POA: Diagnosis not present

## 2022-07-09 DIAGNOSIS — I10 Essential (primary) hypertension: Secondary | ICD-10-CM | POA: Diagnosis not present

## 2022-07-09 DIAGNOSIS — F32A Depression, unspecified: Secondary | ICD-10-CM | POA: Diagnosis not present

## 2022-07-09 DIAGNOSIS — F418 Other specified anxiety disorders: Secondary | ICD-10-CM

## 2022-07-09 DIAGNOSIS — D351 Benign neoplasm of parathyroid gland: Secondary | ICD-10-CM | POA: Insufficient documentation

## 2022-07-09 HISTORY — PX: PARATHYROIDECTOMY: SHX19

## 2022-07-09 LAB — GLUCOSE, CAPILLARY
Glucose-Capillary: 121 mg/dL — ABNORMAL HIGH (ref 70–99)
Glucose-Capillary: 154 mg/dL — ABNORMAL HIGH (ref 70–99)

## 2022-07-09 LAB — POCT PREGNANCY, URINE: Preg Test, Ur: NEGATIVE

## 2022-07-09 SURGERY — PARATHYROIDECTOMY
Anesthesia: General | Laterality: Right

## 2022-07-09 MED ORDER — ROCURONIUM BROMIDE 100 MG/10ML IV SOLN
INTRAVENOUS | Status: DC | PRN
  Administered 2022-07-09: 50 mg via INTRAVENOUS

## 2022-07-09 MED ORDER — PROPOFOL 10 MG/ML IV BOLUS
INTRAVENOUS | Status: DC | PRN
  Administered 2022-07-09: 150 mg via INTRAVENOUS

## 2022-07-09 MED ORDER — ROCURONIUM BROMIDE 10 MG/ML (PF) SYRINGE
PREFILLED_SYRINGE | INTRAVENOUS | Status: AC
  Filled 2022-07-09: qty 10

## 2022-07-09 MED ORDER — EPHEDRINE SULFATE (PRESSORS) 50 MG/ML IJ SOLN
INTRAMUSCULAR | Status: DC | PRN
  Administered 2022-07-09 (×4): 5 mg via INTRAVENOUS
  Administered 2022-07-09: 10 mg via INTRAVENOUS
  Administered 2022-07-09: 77 mg via INTRAVENOUS

## 2022-07-09 MED ORDER — OXYCODONE HCL 5 MG PO TABS: 5.0000 mg | ORAL_TABLET | Freq: Four times a day (QID) | ORAL | 0 refills | Status: AC | PRN

## 2022-07-09 MED ORDER — FENTANYL CITRATE (PF) 100 MCG/2ML IJ SOLN
INTRAMUSCULAR | Status: DC | PRN
  Administered 2022-07-09 (×2): 50 ug via INTRAVENOUS

## 2022-07-09 MED ORDER — ACETAMINOPHEN 500 MG PO TABS
1000.0000 mg | ORAL_TABLET | Freq: Once | ORAL | Status: AC
  Filled 2022-07-09: qty 2

## 2022-07-09 MED ORDER — OXYCODONE HCL 5 MG/5ML PO SOLN: 5.0000 mg | Freq: Once | ORAL | Status: AC | PRN

## 2022-07-09 MED ORDER — ONDANSETRON HCL 4 MG PO TABS: 4.0000 mg | ORAL_TABLET | Freq: Four times a day (QID) | ORAL | 0 refills | Status: AC | PRN

## 2022-07-09 MED ORDER — LIDOCAINE HCL (CARDIAC) PF 100 MG/5ML IV SOSY
PREFILLED_SYRINGE | INTRAVENOUS | Status: DC | PRN
  Administered 2022-07-09: 50 mg via INTRAVENOUS

## 2022-07-09 MED ORDER — LACTATED RINGERS IV SOLN: INTRAVENOUS | Status: DC

## 2022-07-09 MED ORDER — MIDAZOLAM HCL 5 MG/5ML IJ SOLN
INTRAMUSCULAR | Status: DC | PRN
  Administered 2022-07-09 (×2): 1 mg via INTRAVENOUS

## 2022-07-09 MED ORDER — DEXAMETHASONE SODIUM PHOSPHATE 10 MG/ML IJ SOLN
INTRAMUSCULAR | Status: DC | PRN
  Administered 2022-07-09: 4 mg via INTRAVENOUS

## 2022-07-09 MED ORDER — ONDANSETRON HCL 4 MG/2ML IJ SOLN
INTRAMUSCULAR | Status: AC
  Filled 2022-07-09: qty 2

## 2022-07-09 MED ORDER — HEMOSTATIC AGENTS (NO CHARGE) OPTIME
TOPICAL | Status: DC | PRN
  Administered 2022-07-09: 1 via TOPICAL

## 2022-07-09 MED ORDER — OXYCODONE HCL 5 MG PO TABS
5.0000 mg | ORAL_TABLET | Freq: Once | ORAL | Status: AC | PRN
  Administered 2022-07-09: 5 mg via ORAL

## 2022-07-09 MED ORDER — EPHEDRINE 5 MG/ML INJ
INTRAVENOUS | Status: AC
  Filled 2022-07-09: qty 5

## 2022-07-09 MED ORDER — PROPOFOL 10 MG/ML IV BOLUS
INTRAVENOUS | Status: AC
  Filled 2022-07-09: qty 20

## 2022-07-09 MED ORDER — CHLORHEXIDINE GLUCONATE 0.12 % MT SOLN
15.0000 mL | Freq: Once | OROMUCOSAL | Status: AC
  Administered 2022-07-09: 15 mL via OROMUCOSAL

## 2022-07-09 MED ORDER — ORAL CARE MOUTH RINSE: 15.0000 mL | Freq: Once | OROMUCOSAL | Status: AC

## 2022-07-09 MED ORDER — CHLORHEXIDINE GLUCONATE CLOTH 2 % EX PADS: 6.0000 | MEDICATED_PAD | Freq: Once | CUTANEOUS | Status: DC

## 2022-07-09 MED ORDER — LIDOCAINE HCL (PF) 2 % IJ SOLN
INTRAMUSCULAR | Status: AC
  Filled 2022-07-09: qty 5

## 2022-07-09 MED ORDER — DEXAMETHASONE SODIUM PHOSPHATE 10 MG/ML IJ SOLN
INTRAMUSCULAR | Status: AC
  Filled 2022-07-09: qty 1

## 2022-07-09 MED ORDER — BUPIVACAINE HCL 0.25 % IJ SOLN
INTRAMUSCULAR | Status: DC | PRN
  Administered 2022-07-09: 10 mL

## 2022-07-09 MED ORDER — ACETAMINOPHEN 500 MG PO TABS
ORAL_TABLET | ORAL | Status: AC
  Administered 2022-07-09: 1000 mg via ORAL
  Filled 2022-07-09: qty 1

## 2022-07-09 MED ORDER — HYDROMORPHONE HCL 1 MG/ML IJ SOLN
INTRAMUSCULAR | Status: AC
  Filled 2022-07-09: qty 1

## 2022-07-09 MED ORDER — ONDANSETRON HCL 4 MG/2ML IJ SOLN
INTRAMUSCULAR | Status: DC | PRN
  Administered 2022-07-09: 4 mg via INTRAVENOUS

## 2022-07-09 MED ORDER — 0.9 % SODIUM CHLORIDE (POUR BTL) OPTIME
TOPICAL | Status: DC | PRN
  Administered 2022-07-09: 1000 mL

## 2022-07-09 MED ORDER — CEFAZOLIN SODIUM-DEXTROSE 2-4 GM/100ML-% IV SOLN
2.0000 g | INTRAVENOUS | Status: AC
  Administered 2022-07-09: 2 g via INTRAVENOUS
  Filled 2022-07-09: qty 100

## 2022-07-09 MED ORDER — PROMETHAZINE HCL 25 MG/ML IJ SOLN: 6.2500 mg | INTRAMUSCULAR | Status: DC | PRN

## 2022-07-09 MED ORDER — OXYCODONE HCL 5 MG PO TABS
ORAL_TABLET | ORAL | Status: AC
  Filled 2022-07-09: qty 1

## 2022-07-09 MED ORDER — MEPERIDINE HCL 50 MG/ML IJ SOLN: 6.2500 mg | INTRAMUSCULAR | Status: DC | PRN

## 2022-07-09 MED ORDER — MIDAZOLAM HCL 2 MG/2ML IJ SOLN
INTRAMUSCULAR | Status: AC
  Filled 2022-07-09: qty 2

## 2022-07-09 MED ORDER — FENTANYL CITRATE (PF) 250 MCG/5ML IJ SOLN
INTRAMUSCULAR | Status: AC
  Filled 2022-07-09: qty 5

## 2022-07-09 MED ORDER — SUGAMMADEX SODIUM 200 MG/2ML IV SOLN
INTRAVENOUS | Status: DC | PRN
  Administered 2022-07-09: 200 mg via INTRAVENOUS

## 2022-07-09 MED ORDER — HYDROMORPHONE HCL 1 MG/ML IJ SOLN
0.2500 mg | INTRAMUSCULAR | Status: DC | PRN
  Administered 2022-07-09 (×2): 0.25 mg via INTRAVENOUS
  Administered 2022-07-09: 0.5 mg via INTRAVENOUS

## 2022-07-09 MED ORDER — BUPIVACAINE HCL (PF) 0.25 % IJ SOLN
INTRAMUSCULAR | Status: AC
  Filled 2022-07-09: qty 30

## 2022-07-09 SURGICAL SUPPLY — 32 items
ATTRACTOMAT 16X20 MAGNETIC DRP (DRAPES) ×1 IMPLANT
BAG COUNTER SPONGE SURGICOUNT (BAG) ×1 IMPLANT
BLADE SURG 15 STRL LF DISP TIS (BLADE) ×1 IMPLANT
BLADE SURG 15 STRL SS (BLADE) ×1
CHLORAPREP W/TINT 26 (MISCELLANEOUS) ×1 IMPLANT
CLIP TI MEDIUM 6 (CLIP) ×2 IMPLANT
CLIP TI WIDE RED SMALL 6 (CLIP) ×2 IMPLANT
COVER SURGICAL LIGHT HANDLE (MISCELLANEOUS) ×1 IMPLANT
DERMABOND ADVANCED .7 DNX12 (GAUZE/BANDAGES/DRESSINGS) ×1 IMPLANT
DRAPE LAPAROTOMY T 98X78 PEDS (DRAPES) ×1 IMPLANT
DRAPE UTILITY XL STRL (DRAPES) ×1 IMPLANT
ELECT REM PT RETURN 15FT ADLT (MISCELLANEOUS) ×1 IMPLANT
GAUZE 4X4 16PLY ~~LOC~~+RFID DBL (SPONGE) ×1 IMPLANT
GLOVE SURG ORTHO 8.0 STRL STRW (GLOVE) ×1 IMPLANT
GOWN STRL REUS W/ TWL XL LVL3 (GOWN DISPOSABLE) ×3 IMPLANT
GOWN STRL REUS W/TWL XL LVL3 (GOWN DISPOSABLE) ×3
HEMOSTAT SURGICEL 2X4 FIBR (HEMOSTASIS) ×1 IMPLANT
ILLUMINATOR WAVEGUIDE N/F (MISCELLANEOUS) IMPLANT
KIT BASIN OR (CUSTOM PROCEDURE TRAY) ×1 IMPLANT
KIT TURNOVER KIT A (KITS) IMPLANT
NDL HYPO 25X1 1.5 SAFETY (NEEDLE) ×1 IMPLANT
NEEDLE HYPO 25X1 1.5 SAFETY (NEEDLE) ×1 IMPLANT
PACK BASIC VI WITH GOWN DISP (CUSTOM PROCEDURE TRAY) ×1 IMPLANT
PENCIL SMOKE EVACUATOR (MISCELLANEOUS) ×1 IMPLANT
SHEARS HARMONIC 9CM CVD (BLADE) IMPLANT
SUT MNCRL AB 4-0 PS2 18 (SUTURE) ×1 IMPLANT
SUT VIC AB 3-0 SH 18 (SUTURE) ×1 IMPLANT
SYR BULB IRRIG 60ML STRL (SYRINGE) ×1 IMPLANT
SYR CONTROL 10ML LL (SYRINGE) ×1 IMPLANT
TOWEL OR 17X26 10 PK STRL BLUE (TOWEL DISPOSABLE) ×1 IMPLANT
TOWEL OR NON WOVEN STRL DISP B (DISPOSABLE) ×1 IMPLANT
TUBING CONNECTING 10 (TUBING) ×1 IMPLANT

## 2022-07-09 NOTE — Discharge Instructions (Addendum)
[facility] - Dr. Todd Gerkin  THYROID & PARATHYROID SURGERY:  POST-OP INSTRUCTIONS  Always review the instruction sheet provided by the hospital nurse at discharge.  A prescription for pain medication may be sent to your pharmacy at the time of discharge.  Take your pain medication as prescribed.  If narcotic pain medicine is not needed, then you may take acetaminophen (Tylenol) or ibuprofen (Advil) as needed for pain or soreness.  Take your normal home medications as prescribed unless otherwise directed.  If you need a refill on your pain medication, please contact the office during regular business hours.  Prescriptions will not be processed by the office after 5:00PM or on weekends.  Start with a light diet upon arrival home, such as soup and crackers or toast.  Be sure to drink plenty of fluids.  Resume your normal diet the day after surgery.  Most patients will experience some swelling and bruising on the chest and neck area.  Ice packs will help for the first 48 hours after arriving home.  Swelling and bruising will take several days to resolve.   It is common to experience some constipation after surgery.  Increasing fluid intake and taking a stool softener (Colace) will usually help to prevent this problem.  A mild laxative (Milk of Magnesia or Miralax) should be taken according to package directions if there has been no bowel movement after 48 hours.  Dermabond glue covers your incision. This seals the wound and you may shower at any time. The Dermabond will remain in place for about a week.  You may gradually remove the glue when it loosens around the edges.  If you need to loosen the Dermabond for removal, apply a layer of Vaseline to the wound for 15 minutes and then remove with a Kleenex. Your sutures are under the skin and will not show - they will dissolve on their own.  You may resume light daily activities beginning the day after discharge (such as self-care,  walking, climbing stairs), gradually increasing activities as tolerated. You may have sexual intercourse when it is comfortable. Refrain from any heavy lifting or straining until approved by your doctor. You may drive when you no longer are taking prescription pain medication, you can comfortably wear a seatbelt, and you can safely maneuver your car and apply the brakes.  You will see your doctor in the office for a follow-up appointment approximately three weeks after your surgery.  Make sure that you call for this appointment within a day or two after you arrive home to insure a convenient appointment time. Please have any requested laboratory tests performed a few days prior to your office visit so that the results will be available at your follow up appointment.  WHEN TO CALL THE [facility] OFFICE: -- Fever greater than 101.5 -- Inability to urinate -- Nausea and/or vomiting - persistent -- Extreme swelling or bruising -- Continued bleeding from incision -- Increased pain, redness, or drainage from the incision -- Difficulty swallowing or breathing -- Muscle cramping or spasms -- Numbness or tingling in hands or around lips  The clinic staff is available to answer your questions during regular business hours.  Please don't hesitate to call and ask to speak to one of the nurses if you have concerns.  [facility] OFFICE: [phone] (24 hours)  Please sign up for MyChart accounts. This will allow you to communicate directly with my nurse or myself without having to call the office. It will also allow you   to view your test results. You will need to enroll in MyChart for my office ([facility]) and for the hospital ([place]).  Todd Gerkin, MD [facility] practice 

## 2022-07-09 NOTE — Transfer of Care (Signed)
Immediate Anesthesia Transfer of Care Note  Patient: Emma Brown  Procedure(s) Performed: RIGHT INFERIOR PARATHYROIDECTOMY (Right)  Patient Location: PACU  Anesthesia Type:General  Level of Consciousness: oriented, drowsy, and patient cooperative  Airway & Oxygen Therapy: Patient Spontanous Breathing and Patient connected to face mask oxygen  Post-op Assessment: Report given to RN and Post -op Vital signs reviewed and stable  Post vital signs: Reviewed and stable  Last Vitals:  Vitals Value Taken Time  BP    Temp    Pulse    Resp    SpO2      Last Pain:  Vitals:   07/09/22 0554  TempSrc:   PainSc: 0-No pain      Patients Stated Pain Goal: 4 (78/24/23 5361)  Complications: No notable events documented.

## 2022-07-09 NOTE — Anesthesia Procedure Notes (Signed)
Procedure Name: Intubation Date/Time: 07/09/2022 8:29 AM  Performed by: Garrel Ridgel, CRNAPre-anesthesia Checklist: Patient identified, Emergency Drugs available, Suction available and Patient being monitored Patient Re-evaluated:Patient Re-evaluated prior to induction Oxygen Delivery Method: Circle system utilized Preoxygenation: Pre-oxygenation with 100% oxygen Induction Type: IV induction Ventilation: Mask ventilation without difficulty Laryngoscope Size: Mac and 3 Grade View: Grade II Tube type: Oral Tube size: 7.0 mm Number of attempts: 1 Airway Equipment and Method: Stylet and Oral airway Placement Confirmation: ETT inserted through vocal cords under direct vision, positive ETCO2 and breath sounds checked- equal and bilateral Tube secured with: Tape Dental Injury: Teeth and Oropharynx as per pre-operative assessment

## 2022-07-09 NOTE — Interval H&P Note (Signed)
History and Physical Interval Note:  07/09/2022 7:01 AM  Emma Brown  has presented today for surgery, with the diagnosis of PRIMARY HYPERPARATHYROIDISM.  The various methods of treatment have been discussed with the patient and family. After consideration of risks, benefits and other options for treatment, the patient has consented to    Procedure(s): RIGHT INFERIOR PARATHYROIDECTOMY (Right) as a surgical intervention.    The patient's history has been reviewed, patient examined, no change in status, stable for surgery.  I have reviewed the patient's chart and labs.  Questions were answered to the patient's satisfaction.    Armandina Gemma, [facility] [place] practice Office: [place]

## 2022-07-09 NOTE — Anesthesia Postprocedure Evaluation (Signed)
Anesthesia Post Note  Patient: Emma Brown  Procedure(s) Performed: RIGHT INFERIOR PARATHYROIDECTOMY (Right)     Patient location during evaluation: PACU Anesthesia Type: General Level of consciousness: sedated and patient cooperative Pain management: pain level controlled Vital Signs Assessment: post-procedure vital signs reviewed and stable Respiratory status: spontaneous breathing Cardiovascular status: stable Anesthetic complications: no   No notable events documented.  Last Vitals:  Vitals:   07/09/22 1005 07/09/22 1015  BP:  (!) 135/93  Pulse: 71 81  Resp: 17 14  Temp:  36.5 C  SpO2: 91% 94%    Last Pain:  Vitals:   07/09/22 1100  TempSrc:   PainSc: [name]

## 2022-07-09 NOTE — Op Note (Signed)
OPERATIVE REPORT - PARATHYROIDECTOMY  Preoperative diagnosis: Primary hyperparathyroidism  Postop diagnosis: Same  Procedure: Right inferior minimally invasive parathyroidectomy  Surgeon:  Armandina Gemma, MD  Anesthesia: General endotracheal  Estimated blood loss: Minimal  Preparation: ChloraPrep  Indications: Patient is referred by Dr. Vivia Ewing for surgical evaluation and management of primary hyperparathyroidism. Patient was first noted to have hypercalcemia approximately 4 years ago. Patient had been taking a thiazide diuretic and this has been discontinued. Patient was also noted to have vitamin D deficiency and this has been successfully treated. However, recent laboratories continue to show a unsuppressed PTH level of 75 with a history of an elevated PTH level as high as 141. Calcium levels have been as high as 11.4 and are currently at 11.2. Vitamin D is now normal at 31.42. 24-hour urine collection for calcium was elevated at 377. Patient underwent an ultrasound of the neck in January 2023. Patient was noted to have a hypoechoic nodule at the inferior aspect of the right thyroid lobe measuring 1.6 cm. She underwent fine-needle aspiration biopsy with insufficient material for diagnosis. Patient underwent a repeat biopsy in June 2023. This showed follicular cells with atypia. Molecular genetic analysis, AFIRMA, confirmed parathyroid tissue. Patient is now referred for surgery for management of a right inferior parathyroid adenoma. Patient has no prior history of head or neck surgery. There is no family history of parathyroid disease. 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   Procedure: The patient was prepared in the pre-operative holding area. The patient was brought to the operating room and placed in a supine position on the operating room table. Following administration of general anesthesia, the patient was positioned and then  prepped and draped in the usual strict aseptic fashion. After ascertaining that an adequate level of anesthesia been achieved, a neck incision was made with a #15 blade. Dissection was carried through subcutaneous tissues and platysma. Hemostasis was obtained with the electrocautery. Skin flaps were developed circumferentially and a Weitlander retractor was placed for exposure.  Strap muscles were incised in the midline. Strap muscles were reflected laterally exposing the inferior pole of the right thyroid lobe. With gentle blunt dissection the thyroid lobe was mobilized.  Dissection was carried posteriorly and an enlarged parathyroid gland was identified within the right thyrothymic tract. It was gently mobilized. Vascular structures were divided between ligaclips. Care was taken to avoid the recurrent laryngeal nerve posteriorly. The parathyroid gland was completely excised with the thyrothymic tract. It was submitted to pathology where frozen section confirmed hypercellular parathyroid tissue consistent with adenoma.  Neck was irrigated with warm saline and good hemostasis was noted. Fibrillar was placed in the operative field. Strap muscles were approximated in the midline with interrupted 3-0 Vicryl sutures. Platysma was closed with interrupted 3-0 Vicryl sutures. Marcaine was infiltrated circumferentially. Skin was closed with a running 4-0 Monocryl subcuticular suture. Wound was washed and dried and Dermabond was applied. Patient was awakened from anesthesia and brought to the recovery room. The patient tolerated the procedure well.   Armandina Gemma, [facility] Office: [phone]

## 2022-07-10 ENCOUNTER — Encounter (HOSPITAL_COMMUNITY): Payer: Self-pay | Admitting: Surgery

## 2022-07-13 LAB — SURGICAL PATHOLOGY

## 2022-07-13 NOTE — Progress Notes (Signed)
Path as expected - benign parathyroid adenoma.  [place], MD [facility] [place] [facility] Office: [phone]

## 2022-08-17 ENCOUNTER — Other Ambulatory Visit: Payer: Self-pay

## 2022-08-17 MED ORDER — ERGOCALCIFEROL 1.25 MG (50000 UT) PO CAPS: 50000.0000 [IU] | ORAL_CAPSULE | ORAL | 2 refills | Status: AC

## 2022-09-09 ENCOUNTER — Ambulatory Visit: Payer: Medicaid Other | Admitting: Internal Medicine

## 2022-09-09 ENCOUNTER — Encounter: Payer: Self-pay | Admitting: Internal Medicine

## 2022-09-09 VITALS — BP 124/76 | HR 70 | Ht 67.5 in | Wt 209.0 lb

## 2022-09-09 DIAGNOSIS — E042 Nontoxic multinodular goiter: Secondary | ICD-10-CM | POA: Diagnosis not present

## 2022-09-09 DIAGNOSIS — E213 Hyperparathyroidism, unspecified: Secondary | ICD-10-CM

## 2022-09-09 DIAGNOSIS — E559 Vitamin D deficiency, unspecified: Secondary | ICD-10-CM | POA: Diagnosis not present

## 2022-09-09 LAB — ALBUMIN: Albumin: 4.2 g/dL (ref 3.5–5.2)

## 2022-09-09 LAB — VITAMIN D 25 HYDROXY (VIT D DEFICIENCY, FRACTURES): VITD: 29.75 ng/mL — ABNORMAL LOW (ref 30.00–100.00)

## 2022-09-09 LAB — TSH: TSH: 0.92 u[IU]/mL (ref 0.35–5.50)

## 2022-09-09 LAB — CALCIUM: Calcium: 9.3 mg/dL (ref 8.4–10.5)

## 2022-09-09 LAB — T4, FREE: Free T4: 0.88 ng/dL (ref 0.60–1.60)

## 2022-09-09 NOTE — Progress Notes (Signed)
Name: Emma Brown  Age/ Sex: 37 y.o., female   MRN/ DOB: [id number], 1986/02/02     PCP: Lucianne Lei, MD   Reason for Endocrinology Evaluation: Hypercalcemia   Initial Endocrine Consultative Visit: 11/03/2020    PATIENT IDENTIFIER: [name] is a 37 y.o. female with a past medical history of T2DM, and HTN. The patient has followed with Endocrinology clinic since 11/03/2020 for consultative assistance with management of her diabetes.  DIABETIC HISTORY:  Ms. Mangano was diagnosed with DM in 2017,  Was on insulin during pregnancy 2020 . Her hemoglobin A1c has ranged from 7.9% in 2020, peaking at 10.7% in 12/2019.   On her initial clinic to our office she had an A1c of 8% , she was on Ozempic . She was advised to AVOID conception until her A1c at <7.0 %  . She was also advised to stop Ozempic prior to conception . Metformin was added as it was on her list but she was not taking it.   Her PCP increase her Ozempic which caused GI side effects, this was subsequently switched to [place].  Patient was not taking Jardiance and was complaining of nausea with Trulicity  Patient asked me to manage her diabetes 06/2021, as in the past she has indicated her preference to manage through PCP  By her return 10/2021 she was complaining of recurrent yeast infection and was not taking metformin due to GI side effect with 1 tablet    HYPERCALCEMIA HISTORY:   Ms. Blasberg indicates that she was first diagnosed with hypercalcemia during routine labs with a serum calcium of 11.5 mg/dL in 06/2019. She was on HCTZ at the time   No hx of renal stones, or osteoporosis nor fractures  On her initial visit we stopped HCTZ  DXA low bone density 06/2021  24-hour urinary calcium excretion elevated at 377 mg/DL (03/2022)  She is s/p parathyroidectomy 07/09/2022  THYROID HISTORY: She was diagnosed with MNG on thyroid ultrasound 06/2021. She is S/P FNA of the left inferior 1.6 cm nodule with  atypia of undetermined significance ( Bethesda catergory III), were unable to proceed with Afirma due to insufficient sample    Repeat FNA of the right inferior nodule in June [id number] revealed follicular lesion of undetermined significance (Bethesda category III) , Afirma : Parathyroid gland     SUBJECTIVE:     Today (09/09/2022): Ms. Pellman is here for a follow up on hyperparathyroidism, and MNG.    Denies polydipsia  or frequency  Has been constipation  Denies fever  Denies perioral tingling  Denies hand spasms She consumes 2-3 servings of calcium daily Ergocalciferol 50, 000 iu daily     Ergocalciferol 50,000 iu weekly       HISTORY:  Past Medical History:  Past Medical History:  Diagnosis Date   Anxiety    Arthritis    Depression    Diabetes mellitus without complication ([place])    Dyspnea    Fibroid    Gestational diabetes    Heart murmur    Hypertension    Polycystic ovarian disease    Pregnancy induced hypertension    Preterm labor    ST elevation    Vaginal Pap smear, abnormal    pre-cancer cells on cervix   Past Surgical History:  Past Surgical History:  Procedure Laterality Date   CERVICAL BIOPSY  W/ LOOP ELECTRODE EXCISION     CESAREAN SECTION     OVARIAN CYST DRAINAGE     OVARIAN CYST  DRAINAGE     PARATHYROIDECTOMY Right 07/09/2022   Procedure: RIGHT INFERIOR PARATHYROIDECTOMY;  Surgeon: Armandina Gemma, MD;  Location: [facility];  Service: General;  Laterality: Right;   WISDOM TOOTH EXTRACTION     Social History:  reports that she has never smoked. She has never been exposed to tobacco smoke. She has never used smokeless tobacco. She reports that she does not currently use alcohol. She reports that she does not use drugs. Family History:  Family History  Problem Relation Age of Onset   Cancer Mother    Hypertension Mother    Heart disease Father    Hypertension Father    Diabetes Father    Hypertension Brother    Hypertension Brother     Hypertension Maternal Aunt    Hypertension Maternal Uncle    Hypertension Maternal Grandmother    Cancer Maternal Grandfather      HOME MEDICATIONS: Allergies as of 09/09/2022       Reactions   Pineapple Swelling   Shellfish Allergy Shortness Of Breath        Medication List        Accurate as of September 09, 2022  7:11 AM. If you have any questions, ask your nurse or doctor.          Accu-Chek Guide test strip Generic drug: glucose blood 1 each by Other route daily in the afternoon. Use as instructed   amLODipine 10 MG tablet Commonly known as: NORVASC Take 10 mg by mouth daily.   atorvastatin 20 MG tablet Commonly known as: LIPITOR Take 20 mg by mouth at bedtime.   buPROPion 300 MG 24 hr tablet Commonly known as: WELLBUTRIN XL Take 300 mg by mouth daily.   chlorthalidone 25 MG tablet Commonly known as: HYGROTON Take 1 tablet (25 mg total) by mouth every morning.   ergocalciferol 1.25 MG (50000 UT) capsule Commonly known as: VITAMIN D2 Take 1 capsule (50,000 Units total) by mouth once a week.   ibuprofen 800 MG tablet Commonly known as: ADVIL Take 1 tablet (800 mg total) by mouth every 8 (eight) hours as needed.   lisdexamfetamine 30 MG capsule Commonly known as: VYVANSE Take 30 mg by mouth every morning.   metFORMIN 500 MG tablet Commonly known as: GLUCOPHAGE Take 1,000 mg by mouth daily with breakfast.   metoprolol tartrate 25 MG tablet Commonly known as: LOPRESSOR Take 25 mg by mouth daily.   ondansetron 4 MG tablet Commonly known as: ZOFRAN Take 1 tablet (4 mg total) by mouth every 6 (six) hours as needed for nausea or vomiting.   oxyCODONE 5 MG immediate release tablet Commonly known as: Oxy IR/ROXICODONE Take 1 tablet (5 mg total) by mouth every 6 (six) hours as needed for moderate pain.   tirzepatide 7.5 MG/0.5ML Pen Commonly known as: MOUNJARO Inject 7.5 mg into the skin every Sunday.   valACYclovir 1000 MG tablet Commonly known as:  Valtrex Take 1 tablet (1,000 mg total) by mouth 2 (two) times daily. What changed:  when to take this reasons to take this   valsartan 160 MG tablet Commonly known as: DIOVAN Take 160 mg by mouth daily.         OBJECTIVE:   Vital Signs:BP 124/76 (BP Location: Left Arm, Patient Position: Sitting, Cuff Size: Small)   Pulse 70   Ht 5' 7.5" (1.715 m)   Wt 209 lb (94.8 kg)   SpO2 96%   BMI 32.25 kg/m     Wt Readings from Last 3  Encounters:  07/09/22 208 lb (94.3 kg)  07/01/22 208 lb (94.3 kg)  03/26/22 216 lb (98 kg)     Exam: General: Pt appears well and is in NAD  Neck: General: Supple without adenopathy. Thyroid: Thyromegaly noted   Lungs: Clear with good BS bilat with no rales, rhonchi, or wheezes  Heart: RRR, + systolic murmur  Abdomen:  soft, nontender  Extremities: No pretibial edema.   Neuro: MS is good with appropriate affect, pt is alert and Ox3      DATA REVIEWED:   Latest Reference Range & Units 09/09/22 09:31  Calcium 8.4 - 10.5 mg/dL 9.3  Albumin 3.5 - 5.2 g/dL 4.2    Latest Reference Range & Units 09/09/22 09:31  VITD 30.00 - 100.00 ng/mL 29.75 (L)    Latest Reference Range & Units 09/09/22 09:31  TSH 0.35 - 5.50 uIU/mL 0.92  T4,Free(Direct) 0.60 - 1.60 ng/dL 0.88     [facility]02/11/2022 Component 07/27/2022 07/27/2022      Calcium - [facility] 9.6 --  Parathyroid Hormone, Intact - [facility] -- 36    Thyroid ultrasound 07/20/2021 Parenchymal Echotexture: Normal   Isthmus: 0.4 cm   Right lobe: 5.6 x 1.5 x 1.4 cm   Left lobe: 4.8 x 1.0 x 1.9 cm   _________________________________________________________   Estimated total number of nodules >/= 1 cm: 2   Number of spongiform nodules >/=  2 cm not described below (TR1): 0   Number of mixed cystic and solid nodules >/= 1.5 cm not described below (TR2): 0   _________________________________________________________   Nodule labeled 1 is a solid and  cystic isoechoic nodule (TR 2) measuring up to 1.0 cm in the mid right thyroid lobe. This nodule does NOT meet TI-RADS criteria for biopsy or dedicated follow-up.   Nodule labeled 2 is a solid very hypoechoic nodule (TR 4) which appears to be within the most inferior aspect of the right thyroid lobe and measures 1.6 x 1.3 x 0.7 cm. **Given size (>/= 1.5 cm) and appearance, fine needle aspiration of this moderately suspicious nodule should be considered based on TI-RADS criteria.   IMPRESSION: 1. Borderline enlarged multinodular thyroid gland. 2. Nodule labeled 2 in the inferior right thyroid lobe meets criteria for biopsy.     FNA 08/13/2021  Clinical History: Nodule labeled 2 is a solid very hypoechoic nodule  (TR4) which appears to be within the most inferior aspect of the right  thyroid lobe and measures 1.6 x 1.3 x 0.7cm.  Specimen Submitted:  A. THYROID, RIGHT INFERIOR, FINE NEEDLE ASPIRATION:    FINAL MICROSCOPIC DIAGNOSIS:  - Atypia of undetermined significance (Bethesda category III)   Afirma Insufficient     FNA right inferior nodule 11/20/2021  Clinical History: Nodule labeled 2 is a solid very hypoechoic nodule  (TR4) which appears to be within the most inferior aspect of the right  thyroid lobe and measures 1.6 x 1.3 x 0.7 cm. Given size (>/= 1.5 cm)  and appearance, fine needle aspiration of this moderately susp.  Specimen Submitted:  A. THYROID, RIGHT INFERIOR, FINE NEEDLE ASPIRATION:    FINAL MICROSCOPIC DIAGNOSIS:  - Follicular lesion of undetermined significance (Bethesda category III)   AFIRMA : Parathyroid adenoma    Parathyroid Pathology 07/09/2022 . PARATHYROID, RIGHT INFERIOR ADENOMA, PARATHYROIDECTOMY:  - PARATHYROID ADENOMA  - BENIGN THYMIC TISSUE  - BENIGN THYROID TISSUE  - NEGATIVE FOR MALIGNANCY    INTRAOPERATIVE DIAGNOSIS:  A1. PARATHYROID, RIGHT INFERIOR ADENOMA, FROZEN SECTION:  Hypercellular parathyroid (1.41 g), suggestive of  an adenoma.         Suspicious for ectopic thymic tissue.        ASSESSMENT / PLAN / RECOMMENDATIONS:   1) Primary Hyperparathyroidism :   - Resolved with parathyroidectomy. - Repeat serum calcium remains normal postoperatively   2) Vitamin D deficiency:   -Slightly low  - Once she is done with ergocalciferol, she was advised to start OTC Vitamin D3 2000 iu daily   3) Multinodular goiter:  -No local neck symptoms -She is S/P FNA of the left inferior 1.6 cm nodule which Afirma has confirmed this was a Parathyroid adenoma rather than a thyroid nodule -The other thyroid nodule did NOT  meet criteria for further follow-up - TFT's normal    Recommend continuation of Tx with primary MD    Signed electronically by: Mack Guise, MD  [facility]  [address] [place], [place] [address] Phone: [phone] FAX: [phone]   CC: Lucianne Lei, [address] [place] [address] Phone: [phone]  Fax: [phone]  Return to Endocrinology clinic as below: Future Appointments  Date Time Provider Pray  09/09/2022  8:50 AM Jayesh Marbach, Melanie Crazier, MD [id number] None

## 2022-10-05 ENCOUNTER — Other Ambulatory Visit (HOSPITAL_COMMUNITY): Payer: Self-pay

## 2022-10-05 MED ORDER — MOUNJARO 10 MG/0.5ML ~~LOC~~ SOAJ
10.0000 mg | SUBCUTANEOUS | 0 refills | Status: DC
  Filled 2022-10-05: qty 2, 28d supply, fill #0

## 2022-10-11 ENCOUNTER — Other Ambulatory Visit (HOSPITAL_COMMUNITY): Payer: Self-pay

## 2022-11-05 ENCOUNTER — Other Ambulatory Visit (HOSPITAL_COMMUNITY): Payer: Self-pay

## 2022-11-05 MED ORDER — METOPROLOL TARTRATE 25 MG PO TABS
25.0000 mg | ORAL_TABLET | Freq: Two times a day (BID) | ORAL | 1 refills | Status: AC
  Filled 2022-11-05: qty 180, 90d supply, fill #0
  Filled 2023-03-14: qty 180, 90d supply, fill #1
  Filled 2023-03-18: qty 180, 90d supply, fill #0

## 2022-11-05 MED ORDER — VALSARTAN 160 MG PO TABS
160.0000 mg | ORAL_TABLET | Freq: Every day | ORAL | 1 refills | Status: DC
  Filled 2022-11-05: qty 90, 90d supply, fill #0
  Filled 2023-03-14: qty 90, 90d supply, fill #1
  Filled 2023-03-18: qty 90, 90d supply, fill #0

## 2022-11-05 MED ORDER — MOUNJARO 10 MG/0.5ML ~~LOC~~ SOAJ
10.0000 mg | SUBCUTANEOUS | 1 refills | Status: AC
  Filled 2022-11-05: qty 2, 28d supply, fill #0
  Filled 2022-12-19: qty 2, 28d supply, fill #1

## 2022-11-05 MED ORDER — LISDEXAMFETAMINE DIMESYLATE 30 MG PO CAPS
30.0000 mg | ORAL_CAPSULE | Freq: Every day | ORAL | 0 refills | Status: AC
  Filled 2022-11-05: qty 30, 30d supply, fill #0

## 2022-11-05 MED ORDER — AMLODIPINE BESYLATE 10 MG PO TABS
10.0000 mg | ORAL_TABLET | Freq: Every day | ORAL | 0 refills | Status: AC
  Filled 2022-11-05: qty 90, 90d supply, fill #0

## 2022-11-05 MED ORDER — AMLODIPINE BESYLATE 10 MG PO TABS
10.0000 mg | ORAL_TABLET | Freq: Every day | ORAL | 0 refills | Status: DC
  Filled 2023-03-14 – 2023-03-18 (×2): qty 90, 90d supply, fill #0

## 2022-11-05 MED ORDER — HYDROCHLOROTHIAZIDE 12.5 MG PO TABS
12.5000 mg | ORAL_TABLET | Freq: Every morning | ORAL | 1 refills | Status: AC
  Filled 2022-11-05: qty 90, 90d supply, fill #0

## 2022-11-22 ENCOUNTER — Other Ambulatory Visit (HOSPITAL_COMMUNITY): Payer: Self-pay

## 2022-11-22 MED ORDER — LISDEXAMFETAMINE DIMESYLATE 30 MG PO CAPS
30.0000 mg | ORAL_CAPSULE | Freq: Every morning | ORAL | 0 refills | Status: DC
  Filled 2023-01-10: qty 30, 30d supply, fill #0

## 2022-12-20 ENCOUNTER — Other Ambulatory Visit (HOSPITAL_COMMUNITY): Payer: Self-pay

## 2022-12-20 MED ORDER — BUPROPION HCL ER (XL) 300 MG PO TB24
300.0000 mg | ORAL_TABLET | Freq: Every day | ORAL | 0 refills | Status: DC
  Filled 2022-12-20: qty 90, 90d supply, fill #0

## 2023-01-10 ENCOUNTER — Other Ambulatory Visit (HOSPITAL_COMMUNITY): Payer: Self-pay

## 2023-01-10 MED ORDER — MOUNJARO 12.5 MG/0.5ML ~~LOC~~ SOAJ
12.5000 mg | SUBCUTANEOUS | 0 refills | Status: DC
  Filled 2023-01-10: qty 6, 84d supply, fill #0

## 2023-03-14 ENCOUNTER — Other Ambulatory Visit: Payer: Self-pay

## 2023-03-14 ENCOUNTER — Other Ambulatory Visit (HOSPITAL_COMMUNITY): Payer: Self-pay

## 2023-03-15 ENCOUNTER — Other Ambulatory Visit (HOSPITAL_COMMUNITY): Payer: Self-pay

## 2023-03-15 ENCOUNTER — Encounter (HOSPITAL_COMMUNITY): Payer: Self-pay

## 2023-03-15 ENCOUNTER — Other Ambulatory Visit: Payer: Self-pay

## 2023-03-15 MED ORDER — LISDEXAMFETAMINE DIMESYLATE 30 MG PO CAPS
30.0000 mg | ORAL_CAPSULE | Freq: Every day | ORAL | 0 refills | Status: DC
Start: 2023-03-15 — End: 2023-04-22
  Filled 2023-03-15 – 2023-03-18 (×2): qty 30, 30d supply, fill #0

## 2023-03-18 ENCOUNTER — Other Ambulatory Visit (HOSPITAL_COMMUNITY): Payer: Self-pay

## 2023-03-19 ENCOUNTER — Other Ambulatory Visit (HOSPITAL_COMMUNITY): Payer: Self-pay

## 2023-03-21 ENCOUNTER — Other Ambulatory Visit (HOSPITAL_COMMUNITY): Payer: Self-pay

## 2023-04-22 ENCOUNTER — Other Ambulatory Visit (HOSPITAL_COMMUNITY): Payer: Self-pay

## 2023-04-22 MED ORDER — MOUNJARO 12.5 MG/0.5ML ~~LOC~~ SOAJ
12.5000 mg | SUBCUTANEOUS | 0 refills | Status: AC
  Filled 2023-04-22 – 2023-05-05 (×2): qty 2, 28d supply, fill #0

## 2023-04-22 MED ORDER — VALSARTAN 160 MG PO TABS
160.0000 mg | ORAL_TABLET | Freq: Every day | ORAL | 1 refills | Status: AC
  Filled 2023-04-22 – 2023-06-12 (×2): qty 90, 90d supply, fill #0

## 2023-04-22 MED ORDER — LISDEXAMFETAMINE DIMESYLATE 30 MG PO CAPS
30.0000 mg | ORAL_CAPSULE | Freq: Every day | ORAL | 0 refills | Status: DC
Start: 2023-04-22 — End: 2023-06-19
  Filled 2023-04-22 – 2023-05-05 (×2): qty 30, 30d supply, fill #0

## 2023-04-22 MED ORDER — AMLODIPINE BESYLATE 10 MG PO TABS
10.0000 mg | ORAL_TABLET | Freq: Every day | ORAL | 1 refills | Status: AC
  Filled 2023-04-22 – 2023-06-12 (×2): qty 90, 90d supply, fill #0

## 2023-05-01 ENCOUNTER — Other Ambulatory Visit: Payer: Self-pay

## 2023-05-01 ENCOUNTER — Emergency Department (HOSPITAL_COMMUNITY): Payer: Medicaid Other

## 2023-05-01 ENCOUNTER — Emergency Department (HOSPITAL_COMMUNITY)
Admission: EM | Admit: 2023-05-01 | Discharge: 2023-05-02 | Disposition: A | Discharge status: Home / Self Care | Payer: Medicaid Other | Attending: Emergency Medicine | Admitting: Emergency Medicine

## 2023-05-01 ENCOUNTER — Encounter (HOSPITAL_COMMUNITY): Payer: Self-pay

## 2023-05-01 DIAGNOSIS — S0990XA Unspecified injury of head, initial encounter: Secondary | ICD-10-CM | POA: Diagnosis not present

## 2023-05-01 DIAGNOSIS — I10 Essential (primary) hypertension: Secondary | ICD-10-CM | POA: Diagnosis not present

## 2023-05-01 DIAGNOSIS — M25562 Pain in left knee: Secondary | ICD-10-CM | POA: Diagnosis not present

## 2023-05-01 DIAGNOSIS — Z7984 Long term (current) use of oral hypoglycemic drugs: Secondary | ICD-10-CM | POA: Diagnosis not present

## 2023-05-01 DIAGNOSIS — M79605 Pain in left leg: Secondary | ICD-10-CM | POA: Diagnosis present

## 2023-05-01 DIAGNOSIS — E119 Type 2 diabetes mellitus without complications: Secondary | ICD-10-CM | POA: Insufficient documentation

## 2023-05-01 DIAGNOSIS — Z79899 Other long term (current) drug therapy: Secondary | ICD-10-CM | POA: Diagnosis not present

## 2023-05-01 DIAGNOSIS — W228XXA Striking against or struck by other objects, initial encounter: Secondary | ICD-10-CM | POA: Diagnosis not present

## 2023-05-01 DIAGNOSIS — M25572 Pain in left ankle and joints of left foot: Secondary | ICD-10-CM

## 2023-05-01 DIAGNOSIS — W19XXXA Unspecified fall, initial encounter: Secondary | ICD-10-CM

## 2023-05-01 LAB — BASIC METABOLIC PANEL
Anion gap: 9 (ref 5–15)
BUN: 14 mg/dL (ref 6–20)
CO2: 23 mmol/L (ref 22–32)
Calcium: 9.4 mg/dL (ref 8.9–10.3)
Chloride: 106 mmol/L (ref 98–111)
Creatinine, Ser: 0.99 mg/dL (ref 0.44–1.00)
GFR, Estimated: >60 mL/min (ref >60)
Glucose, Bld: 116 mg/dL — ABNORMAL HIGH (ref 70–99)
Potassium: 3.5 mmol/L (ref 3.5–5.1)
Sodium: 138 mmol/L (ref 135–145)

## 2023-05-01 LAB — CBC
HCT: 36.6 % (ref 36.0–46.0)
Hemoglobin: 12 g/dL (ref 12.0–15.0)
MCH: 26.7 pg (ref 26.0–34.0)
MCHC: 32.8 g/dL (ref 30.0–36.0)
MCV: 81.5 fL (ref 80.0–100.0)
Platelets: 381 10*3/uL (ref 150–400)
RBC: 4.49 MIL/uL (ref 3.87–5.11)
RDW: 13.2 % (ref 11.5–15.5)
WBC: 8.1 10*3/uL (ref 4.0–10.5)
nRBC: 0 % (ref 0.0–0.2)

## 2023-05-01 LAB — HCG, SERUM, QUALITATIVE: Preg, Serum: NEGATIVE

## 2023-05-01 MED ORDER — NAPROXEN 500 MG PO TABS
500.0000 mg | ORAL_TABLET | Freq: Two times a day (BID) | ORAL | 0 refills | Status: AC
  Filled 2023-05-01: qty 30, 15d supply, fill #0

## 2023-05-01 NOTE — ED Notes (Signed)
Wound washed with soap and water dressed with gause and kling
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
## 2023-05-01 NOTE — ED Provider Notes (Signed)
[place] EMERGENCY DEPARTMENT AT [facility] Provider Note   CSN: [id number] Arrival date & time: 05/01/23  1844     History  Chief Complaint  Patient presents with   Emma Brown    Muntaha Marrano is a 37 y.o. female. Patient complains of left leg pain and possible head injury.  Patient states she was in a rush to get out of her car and left the car in gear.  When jumping out of the car she hit the head on the door frame.  She denies losing consciousness.  Patient states that while running to the house after getting out of the car she stepped in something describes a manhole type area and hurt her left lower leg.  She denies loss of consciousness, neck pain, back pain, dizziness, vision changes.  Past medical history significant for type II DM, anxiety, hypertension   Fall       Home Medications Prior to Admission medications   Medication Sig Start Date End Date Taking? Authorizing Provider  naproxen (NAPROSYN) 500 MG tablet Take 1 tablet (500 mg total) by mouth 2 (two) times daily. 05/01/23  Yes Barrie Dunker B, PA-C  amLODipine (NORVASC) 10 MG tablet Take 10 mg by mouth daily.    [provider]  amLODipine (NORVASC) 10 MG tablet Take 1 tablet (10 mg total) by mouth at bedtime. 11/05/22     amLODipine (NORVASC) 10 MG tablet Take 1 tablet (10 mg total) by mouth at bedtime. 04/22/23     atorvastatin (LIPITOR) 20 MG tablet Take 20 mg by mouth at bedtime. 03/01/22   [provider]  buPROPion (WELLBUTRIN XL) 300 MG 24 hr tablet Take 300 mg by mouth daily. 10/04/21   [provider]  buPROPion (WELLBUTRIN XL) 300 MG 24 hr tablet Take 1 tablet (300 mg total) by mouth daily. 12/20/22     chlorthalidone (HYGROTON) 25 MG tablet Take 1 tablet (25 mg total) by mouth every morning. 01/08/22 09/09/22  Tolia, Sunit, DO  ergocalciferol (VITAMIN D2) 1.25 MG (50000 UT) capsule Take 1 capsule (50,000 Units total) by mouth once a week. 08/17/22   Shamleffer, Konrad Dolores, MD  glucose blood (ACCU-CHEK GUIDE) test strip 1 each by Other route daily in the afternoon. Use as instructed 10/30/21   Shamleffer, Konrad Dolores, MD  hydrochlorothiazide (HYDRODIURIL) 12.5 MG tablet Take 1 tablet (12.5 mg total) by mouth in the morning. 11/05/22     ibuprofen (ADVIL) 800 MG tablet Take 1 tablet (800 mg total) by mouth every 8 (eight) hours as needed. 11/19/21   Brock Bad, MD  lisdexamfetamine (VYVANSE) 30 MG capsule Take 30 mg by mouth every morning. 06/17/22   [provider]  lisdexamfetamine (VYVANSE) 30 MG capsule Take 1 capsule (30 mg total) by mouth daily. 11/05/22     lisdexamfetamine (VYVANSE) 30 MG capsule Take 1 capsule (30 mg total) by mouth daily. 04/22/23     metFORMIN (GLUCOPHAGE) 500 MG tablet Take 1,000 mg by mouth daily with breakfast.    [provider]  metoprolol tartrate (LOPRESSOR) 25 MG tablet Take 25 mg by mouth daily. 06/04/22   [provider]  metoprolol tartrate (LOPRESSOR) 25 MG tablet Take 1 tablet (25 mg total) by mouth 2 (two) times daily. 11/05/22     ondansetron (ZOFRAN) 4 MG tablet Take 1 tablet (4 mg total) by mouth every 6 (six) hours as needed for nausea or vomiting. 07/09/22   Darnell Level, MD  oxyCODONE (OXY IR/ROXICODONE) 5 MG  immediate release tablet Take 1 tablet (5 mg total) by mouth every 6 (six) hours as needed for moderate pain. 07/09/22   Darnell Level, MD  tirzepatide [facility]) 10 MG/0.5ML Pen Inject 10 mg into the skin once a week. 11/05/22     tirzepatide (MOUNJARO) 12.5 MG/0.5ML Pen Inject 12.5 mg into the skin once a week. 01/10/23     tirzepatide (MOUNJARO) 12.5 MG/0.5ML Pen Inject 12.5 mg into the skin once a week. 04/22/23     tirzepatide (MOUNJARO) 7.5 MG/0.5ML Pen Inject 7.5 mg into the skin every Sunday. Patient not taking: Reported on 09/09/2022 03/01/22   [provider]  valACYclovir (VALTREX) 1000 MG tablet Take 1 tablet (1,000 mg total) by mouth 2 (two) times daily. Patient  taking differently: Take 1,000 mg by mouth daily as needed (outbreaks). 11/19/21   Brock Bad, MD  valsartan (DIOVAN) 160 MG tablet Take 160 mg by mouth daily.    [provider]  valsartan (DIOVAN) 160 MG tablet Take 1 tablet (160 mg total) by mouth daily. 04/22/23         Allergies    Pineapple and Shellfish allergy    Review of Systems   Review of Systems  Physical Exam Updated Vital Signs BP 116/84   Pulse 63   Temp 97.8 F (36.6 C) (Oral)   Resp 16   Ht 5\' 7"  (1.702 m)   Wt 94.8 kg   SpO2 100%   BMI 32.73 kg/m  Physical Exam Vitals and nursing note reviewed.  Constitutional:      General: She is not in acute distress.    Appearance: She is well-developed.  HENT:     Head: Normocephalic and atraumatic.  Eyes:     Conjunctiva/sclera: Conjunctivae normal.  Cardiovascular:     Rate and Rhythm: Normal rate.  Pulmonary:     Effort: Pulmonary effort is normal.  Musculoskeletal:        General: Tenderness and signs of injury present. No swelling or deformity. Normal range of motion.     Cervical back: Normal range of motion and neck supple. No tenderness.     Comments: Patient with tenderness to the lateral malleolus of the left lower extremity.  Palpable pedal pulse.  Sensation intact.  Patient with grossly normal range of motion at the left ankle.  Skin:    General: Skin is warm and dry.     Capillary Refill: Capillary refill takes less than 2 seconds.  Neurological:     General: No focal deficit present.     Mental Status: She is alert.  Psychiatric:        Mood and Affect: Mood normal.     ED Results / Procedures / Treatments   Labs (all labs ordered are listed, but only abnormal results are displayed) Labs Reviewed  BASIC METABOLIC PANEL - Abnormal; Notable for the following components:      Result Value   Glucose, Bld 116 (*)    All other components within normal limits  CBC  HCG, SERUM, QUALITATIVE    EKG None  Radiology DG Ankle  Complete Left  Result Date: 05/01/2023 CLINICAL DATA:  Status post trauma. EXAM: LEFT ANKLE COMPLETE - 3+ VIEW COMPARISON:  None Available. FINDINGS: There is no evidence of fracture, dislocation, or joint effusion. There is no evidence of arthropathy or other focal bone abnormality. Mild anterior soft tissue swelling is seen. IMPRESSION: Mild anterior soft tissue swelling without evidence of an acute osseous abnormality. Electronically Signed  By: Aram Candela M.D.   On: 05/01/2023 20:09   DG Tibia/Fibula Left Port  Result Date: 05/01/2023 CLINICAL DATA:  Status post trauma. EXAM: PORTABLE LEFT TIBIA AND FIBULA - 2 VIEW COMPARISON:  None Available. FINDINGS: There is no evidence of acute fracture or dislocation. No focal osseous lesions are identified. Soft tissue structures are unremarkable. IMPRESSION: Negative. Electronically Signed   By: Aram Candela M.D.   On: 05/01/2023 20:08   CT HEAD WO CONTRAST  Result Date: 05/01/2023 CLINICAL DATA:  Head trauma, moderate-severe; fall EXAM: CT HEAD WITHOUT CONTRAST CT CERVICAL SPINE WITHOUT CONTRAST TECHNIQUE: Multidetector CT imaging of the head and cervical spine was performed following the standard protocol without intravenous contrast. Multiplanar CT image reconstructions of the cervical spine were also generated. RADIATION DOSE REDUCTION: This exam was performed according to the departmental dose-optimization program which includes automated exposure control, adjustment of the mA and/or kV according to patient size and/or use of iterative reconstruction technique. COMPARISON:  None Available. FINDINGS: CT HEAD FINDINGS Brain: No hemorrhage. No hydrocephalus. No extra-axial fluid collection. No CT evidence of an acute cortical infarct. No mass effect. No mass lesion. Vascular: No hyperdense vessel or unexpected calcification. Skull: Normal. Negative for fracture or focal lesion. Sinuses/Orbits: No middle ear or mastoid effusion. Paranasal  sinuses are clear. Orbits are unremarkable. Other: None. CT CERVICAL SPINE FINDINGS Alignment: Normal. Skull base and vertebrae: No acute fracture. No primary bone lesion or focal pathologic process. Soft tissues and spinal canal: No prevertebral fluid or swelling. No visible canal hematoma. Disc levels:  No evidence of high-grade spinal canal stenosis. Upper chest: Negative. Other: None IMPRESSION: 1. No CT evidence of intracranial injury. 2. No acute fracture or traumatic subluxation of the cervical spine. Electronically Signed   By: Lorenza Cambridge M.D.   On: 05/01/2023 19:52   CT Cervical Spine Wo Contrast  Result Date: 05/01/2023 CLINICAL DATA:  Head trauma, moderate-severe; fall EXAM: CT HEAD WITHOUT CONTRAST CT CERVICAL SPINE WITHOUT CONTRAST TECHNIQUE: Multidetector CT imaging of the head and cervical spine was performed following the standard protocol without intravenous contrast. Multiplanar CT image reconstructions of the cervical spine were also generated. RADIATION DOSE REDUCTION: This exam was performed according to the departmental dose-optimization program which includes automated exposure control, adjustment of the mA and/or kV according to patient size and/or use of iterative reconstruction technique. COMPARISON:  None Available. FINDINGS: CT HEAD FINDINGS Brain: No hemorrhage. No hydrocephalus. No extra-axial fluid collection. No CT evidence of an acute cortical infarct. No mass effect. No mass lesion. Vascular: No hyperdense vessel or unexpected calcification. Skull: Normal. Negative for fracture or focal lesion. Sinuses/Orbits: No middle ear or mastoid effusion. Paranasal sinuses are clear. Orbits are unremarkable. Other: None. CT CERVICAL SPINE FINDINGS Alignment: Normal. Skull base and vertebrae: No acute fracture. No primary bone lesion or focal pathologic process. Soft tissues and spinal canal: No prevertebral fluid or swelling. No visible canal hematoma. Disc levels:  No evidence of  high-grade spinal canal stenosis. Upper chest: Negative. Other: None IMPRESSION: 1. No CT evidence of intracranial injury. 2. No acute fracture or traumatic subluxation of the cervical spine. Electronically Signed   By: Lorenza Cambridge M.D.   On: 05/01/2023 19:52    Procedures .Ortho Injury Treatment  Date/Time: 05/01/2023 11:27 PM  Performed by: Darrick Grinder, PA-C Authorized by: Darrick Grinder, PA-C   Consent:    Consent obtained:  Verbal   Consent given by:  Patient   Risks discussed:  Vascular damage,  stiffness, restricted joint movement and nerve damageInjury location: ankle Location details: left ankle Injury type: soft tissue Pre-procedure neurovascular assessment: neurovascularly intact Immobilization: CAM Walker boot with crutches. Splint Applied by: Milon Dikes Post-procedure neurovascular assessment: post-procedure neurovascularly intact       Medications Ordered in ED Medications - No data to display  ED Course/ Medical Decision Making/ A&P                                 Medical Decision Making Amount and/or Complexity of Data Reviewed Labs: ordered. Radiology: ordered.   This patient presents to the ED for concern of left lower leg pain and headache, this involves an extensive number of treatment options, and is a complaint that carries with it a high risk of complications and morbidity.  The differential diagnosis includes intracranial abnormality, fracture, dislocation, soft tissue injury   Co morbidities that complicate the patient evaluation  T2DM, HTN   Lab Tests:  I Ordered, and personally interpreted labs.  The pertinent results include: Unremarkable BMP, CBC, negative pregnancy test   Imaging Studies ordered:  I ordered imaging studies including CT head and cervical spine without contrast, plain films of the left ankle and left tibia fibula I independently visualized and interpreted imaging which showed no acute fracture or dislocation of  the left lower leg or C-spine, no intracranial abnormality I agree with the radiologist interpretation   Social Determinants of Health:  Patient has Medicaid for her primary health insurance   Test / Admission - Considered:  Patient with no acute fracture or abnormality on imaging.  Injury concerning for ankle sprain.  She did have a small skin abrasion on the left shin which was cleaned and bandage.  Patient was placed in a cam walker boot with crutches for ambulation.  Patient instructed on reduced weightbearing until symptoms improve.  Patient will then transition from crutches to the cam walker boot effectively transition to walking without the boot.  If patient fails to see improvement she will follow-up with orthopedics.  Prescription for Naprosyn sent to pharmacy.  Discharge home with return precautions.         Final Clinical Impression(s) / ED Diagnoses Final diagnoses:  Fall, initial encounter  Minor head injury, initial encounter  Acute left ankle pain    Rx / DC Orders ED Discharge Orders          Ordered    naproxen (NAPROSYN) 500 MG tablet  2 times daily        05/01/23 2350              Pamala Duffel 05/01/23 2351    Glynn Octave, MD 05/02/23 [phone]

## 2023-05-01 NOTE — Discharge Instructions (Addendum)
Your workup today was reassuring.  Your ankle injury seems consistent with a sprain.  Please use the walking boot and crutches to reduce weightbearing until symptoms improve.  At that time you may stop using the crutches and continue to use the boot until pain subsides completely at which point you may remove the boot.  If your symptoms do not improve over the next few weeks I recommend following up with orthopedic surgery.  I have attached the contact formation for the on-call provider.  I have sent a prescription for Naprosyn to your pharmacy.  If you develop any emergent symptoms please return to the emergency department.

## 2023-05-02 ENCOUNTER — Other Ambulatory Visit (HOSPITAL_COMMUNITY): Payer: Self-pay

## 2023-05-02 NOTE — Progress Notes (Signed)
Orthopedic Tech Progress Note Patient Details:  Emma Brown 1985-12-29 [id number]  Ortho Devices Type of Ortho Device: Crutches, CAM walker Ortho Device/Splint Location: lle Ortho Device/Splint Interventions: Ordered, Application, Adjustment   Post Interventions Patient Tolerated: Well Instructions Provided: Care of device, Adjustment of device  Trinna Post 05/02/2023, 12:08 AM

## 2023-05-04 ENCOUNTER — Other Ambulatory Visit (HOSPITAL_COMMUNITY): Payer: Self-pay

## 2023-05-05 ENCOUNTER — Other Ambulatory Visit (HOSPITAL_COMMUNITY): Payer: Self-pay

## 2023-05-05 MED ORDER — GABAPENTIN 300 MG PO CAPS
300.0000 mg | ORAL_CAPSULE | Freq: Every day | ORAL | 1 refills | Status: AC
  Filled 2023-05-05: qty 30, 30d supply, fill #0

## 2023-06-12 ENCOUNTER — Other Ambulatory Visit (HOSPITAL_COMMUNITY): Payer: Self-pay

## 2023-06-13 ENCOUNTER — Other Ambulatory Visit: Payer: Self-pay

## 2023-06-13 ENCOUNTER — Other Ambulatory Visit (HOSPITAL_COMMUNITY): Payer: Self-pay

## 2023-06-13 MED ORDER — BUPROPION HCL ER (XL) 300 MG PO TB24
300.0000 mg | ORAL_TABLET | Freq: Every day | ORAL | 0 refills | Status: DC
  Filled 2023-06-13: qty 90, 90d supply, fill #0

## 2023-06-17 MED ORDER — TIRZEPATIDE 12.5 MG/0.5ML ~~LOC~~ SOAJ
12.5000 mg | SUBCUTANEOUS | 0 refills | Status: AC
  Filled 2023-06-17: qty 6, 84d supply, fill #0

## 2023-06-19 ENCOUNTER — Other Ambulatory Visit (HOSPITAL_COMMUNITY): Payer: Self-pay

## 2023-06-19 MED ORDER — LISDEXAMFETAMINE DIMESYLATE 30 MG PO CAPS
30.0000 mg | ORAL_CAPSULE | Freq: Every day | ORAL | 0 refills | Status: DC
  Filled 2023-06-19: qty 30, 30d supply, fill #0

## 2023-06-20 ENCOUNTER — Other Ambulatory Visit (HOSPITAL_COMMUNITY): Payer: Self-pay

## 2023-06-24 ENCOUNTER — Other Ambulatory Visit (HOSPITAL_COMMUNITY): Payer: Self-pay

## 2023-09-09 ENCOUNTER — Other Ambulatory Visit: Payer: Self-pay

## 2023-09-09 ENCOUNTER — Other Ambulatory Visit (HOSPITAL_COMMUNITY): Payer: Self-pay

## 2023-09-09 MED ORDER — OMEGA-3-ACID ETHYL ESTERS 1 G PO CAPS
1000.0000 mg | ORAL_CAPSULE | Freq: Two times a day (BID) | ORAL | 3 refills | Status: AC
  Filled 2023-09-09: qty 60, 30d supply, fill #0

## 2023-09-09 MED ORDER — MOUNJARO 7.5 MG/0.5ML ~~LOC~~ SOAJ
7.5000 mg | SUBCUTANEOUS | 0 refills | Status: DC
  Filled 2023-09-09: qty 2, 28d supply, fill #0

## 2023-09-09 MED ORDER — AMLODIPINE BESYLATE 10 MG PO TABS
10.0000 mg | ORAL_TABLET | Freq: Every day | ORAL | 1 refills | Status: AC
  Filled 2023-09-09: qty 90, 90d supply, fill #0

## 2023-09-09 MED ORDER — METOPROLOL TARTRATE 25 MG PO TABS
25.0000 mg | ORAL_TABLET | Freq: Every day | ORAL | 1 refills | Status: AC
  Filled 2023-09-09: qty 90, 90d supply, fill #0

## 2023-09-09 MED ORDER — VALSARTAN 160 MG PO TABS
160.0000 mg | ORAL_TABLET | Freq: Every day | ORAL | 1 refills | Status: AC
  Filled 2023-09-09: qty 90, 90d supply, fill #0
  Filled 2024-05-08: qty 90, 90d supply, fill #1

## 2023-09-09 MED ORDER — LISDEXAMFETAMINE DIMESYLATE 30 MG PO CAPS
30.0000 mg | ORAL_CAPSULE | Freq: Every day | ORAL | 0 refills | Status: DC
  Filled 2023-09-09: qty 30, 30d supply, fill #0

## 2023-09-09 MED ORDER — BUPROPION HCL ER (XL) 300 MG PO TB24
300.0000 mg | ORAL_TABLET | Freq: Every day | ORAL | 0 refills | Status: DC
  Filled 2023-09-09: qty 90, 90d supply, fill #0

## 2023-09-13 ENCOUNTER — Other Ambulatory Visit (HOSPITAL_COMMUNITY): Payer: Self-pay

## 2023-10-26 ENCOUNTER — Other Ambulatory Visit (HOSPITAL_COMMUNITY): Payer: Self-pay

## 2023-10-28 ENCOUNTER — Other Ambulatory Visit (HOSPITAL_COMMUNITY): Payer: Self-pay

## 2023-10-28 ENCOUNTER — Encounter (HOSPITAL_COMMUNITY): Payer: Self-pay

## 2023-10-28 MED ORDER — LISDEXAMFETAMINE DIMESYLATE 30 MG PO CAPS
30.0000 mg | ORAL_CAPSULE | Freq: Every day | ORAL | 0 refills | Status: DC
  Filled 2023-10-28 – 2023-11-04 (×3): qty 30, 30d supply, fill #0

## 2023-10-28 MED ORDER — BUPROPION HCL ER (XL) 300 MG PO TB24
300.0000 mg | ORAL_TABLET | Freq: Every day | ORAL | 0 refills | Status: DC
  Filled 2023-10-28 – 2023-11-04 (×2): qty 30, 30d supply, fill #0
  Filled 2024-05-08: qty 90, 90d supply, fill #0

## 2023-10-28 MED ORDER — MOUNJARO 7.5 MG/0.5ML ~~LOC~~ SOAJ
7.5000 mg | SUBCUTANEOUS | 0 refills | Status: DC
  Filled 2023-10-28 – 2024-03-20 (×2): qty 6, 84d supply, fill #0

## 2023-11-04 ENCOUNTER — Other Ambulatory Visit (HOSPITAL_COMMUNITY): Payer: Self-pay

## 2024-01-04 ENCOUNTER — Other Ambulatory Visit (HOSPITAL_COMMUNITY): Payer: Self-pay

## 2024-01-05 ENCOUNTER — Encounter (HOSPITAL_COMMUNITY): Payer: Self-pay

## 2024-01-05 ENCOUNTER — Other Ambulatory Visit (HOSPITAL_COMMUNITY): Payer: Self-pay

## 2024-02-06 ENCOUNTER — Other Ambulatory Visit: Payer: Self-pay

## 2024-02-06 ENCOUNTER — Encounter (HOSPITAL_COMMUNITY): Payer: Self-pay

## 2024-02-06 ENCOUNTER — Other Ambulatory Visit (HOSPITAL_COMMUNITY): Payer: Self-pay

## 2024-02-06 MED ORDER — MOUNJARO 5 MG/0.5ML ~~LOC~~ SOAJ
5.0000 mg | SUBCUTANEOUS | 0 refills | Status: AC
  Filled 2024-02-06: qty 2, 28d supply, fill #0

## 2024-02-06 MED ORDER — VALSARTAN 160 MG PO TABS
160.0000 mg | ORAL_TABLET | Freq: Every day | ORAL | 1 refills | Status: AC
  Filled 2024-02-06: qty 90, 90d supply, fill #0

## 2024-02-06 MED ORDER — DAPAGLIFLOZIN PROPANEDIOL 10 MG PO TABS
10.0000 mg | ORAL_TABLET | Freq: Every day | ORAL | 3 refills | Status: AC
  Filled 2024-02-06 – 2024-02-10 (×2): qty 30, 30d supply, fill #0
  Filled 2024-03-20: qty 30, 30d supply, fill #1
  Filled 2024-05-08: qty 30, 30d supply, fill #2
  Filled 2024-06-22: qty 30, 30d supply, fill #3

## 2024-02-06 MED ORDER — LISDEXAMFETAMINE DIMESYLATE 30 MG PO CAPS
30.0000 mg | ORAL_CAPSULE | Freq: Every day | ORAL | 0 refills | Status: DC
  Filled 2024-02-06: qty 30, 30d supply, fill #0

## 2024-02-06 MED ORDER — METOPROLOL TARTRATE 25 MG PO TABS
25.0000 mg | ORAL_TABLET | Freq: Every day | ORAL | 1 refills | Status: AC
  Filled 2024-02-06: qty 90, 90d supply, fill #0
  Filled 2024-05-08: qty 90, 90d supply, fill #1

## 2024-02-06 MED ORDER — OMEGA-3-ACID ETHYL ESTERS 1 G PO CAPS
1000.0000 mg | ORAL_CAPSULE | Freq: Two times a day (BID) | ORAL | 3 refills | Status: AC
  Filled 2024-02-06: qty 60, 30d supply, fill #0

## 2024-02-06 MED ORDER — AMLODIPINE BESYLATE 10 MG PO TABS
10.0000 mg | ORAL_TABLET | Freq: Every evening | ORAL | 1 refills | Status: AC
  Filled 2024-02-06: qty 90, 90d supply, fill #0
  Filled 2024-05-08: qty 90, 90d supply, fill #1

## 2024-02-06 MED ORDER — GABAPENTIN 300 MG PO CAPS
300.0000 mg | ORAL_CAPSULE | Freq: Every day | ORAL | 1 refills | Status: AC
  Filled 2024-02-06: qty 30, 30d supply, fill #0

## 2024-02-06 MED ORDER — ATORVASTATIN CALCIUM 20 MG PO TABS
20.0000 mg | ORAL_TABLET | Freq: Every day | ORAL | 3 refills | Status: AC
  Filled 2024-02-06: qty 90, 90d supply, fill #0
  Filled 2024-05-08: qty 90, 90d supply, fill #1

## 2024-02-07 ENCOUNTER — Other Ambulatory Visit (HOSPITAL_COMMUNITY): Payer: Self-pay

## 2024-02-07 MED ORDER — SERTRALINE HCL 25 MG PO TABS
25.0000 mg | ORAL_TABLET | Freq: Every day | ORAL | 0 refills | Status: DC
  Filled 2024-02-07: qty 30, 30d supply, fill #0

## 2024-02-08 ENCOUNTER — Other Ambulatory Visit (HOSPITAL_COMMUNITY): Payer: Self-pay

## 2024-02-10 ENCOUNTER — Other Ambulatory Visit (HOSPITAL_COMMUNITY): Payer: Self-pay

## 2024-02-26 ENCOUNTER — Other Ambulatory Visit (HOSPITAL_COMMUNITY): Payer: Self-pay

## 2024-02-26 MED ORDER — SUMATRIPTAN SUCCINATE 25 MG PO TABS
25.0000 mg | ORAL_TABLET | ORAL | 1 refills | Status: AC
  Filled 2024-02-26: qty 9, 30d supply, fill #0
  Filled 2024-06-22: qty 9, 30d supply, fill #1

## 2024-02-29 ENCOUNTER — Other Ambulatory Visit (HOSPITAL_COMMUNITY)
Admission: RE | Admit: 2024-02-29 | Discharge: 2024-02-29 | Disposition: A | Discharge status: Home / Self Care | Source: Ambulatory Visit | Attending: Family Medicine | Admitting: Family Medicine

## 2024-02-29 ENCOUNTER — Other Ambulatory Visit: Payer: Self-pay | Admitting: Family Medicine

## 2024-02-29 ENCOUNTER — Other Ambulatory Visit (HOSPITAL_COMMUNITY): Payer: Self-pay

## 2024-02-29 DIAGNOSIS — Z124 Encounter for screening for malignant neoplasm of cervix: Secondary | ICD-10-CM | POA: Diagnosis present

## 2024-02-29 MED ORDER — VALACYCLOVIR HCL 500 MG PO TABS
500.0000 mg | ORAL_TABLET | Freq: Every day | ORAL | 3 refills | Status: AC | PRN
  Filled 2024-02-29: qty 30, 30d supply, fill #0
  Filled 2024-06-22: qty 30, 30d supply, fill #1

## 2024-03-02 LAB — MOLECULAR ANCILLARY ONLY
Bacterial Vaginitis (gardnerella): NEGATIVE
Candida Glabrata: NEGATIVE
Candida Vaginitis: NEGATIVE
Chlamydia: NEGATIVE
Comment: NEGATIVE
Comment: NEGATIVE
Comment: NEGATIVE
Comment: NEGATIVE
Comment: NEGATIVE
Comment: NORMAL
Neisseria Gonorrhea: NEGATIVE
Trichomonas: NEGATIVE

## 2024-03-05 LAB — CYTOLOGY - PAP: Diagnosis: NEGATIVE

## 2024-03-20 ENCOUNTER — Other Ambulatory Visit (HOSPITAL_COMMUNITY): Payer: Self-pay

## 2024-03-20 ENCOUNTER — Other Ambulatory Visit: Payer: Self-pay

## 2024-03-20 MED ORDER — LISDEXAMFETAMINE DIMESYLATE 30 MG PO CAPS
30.0000 mg | ORAL_CAPSULE | Freq: Every day | ORAL | 0 refills | Status: AC
  Filled 2024-03-20: qty 30, 30d supply, fill #0

## 2024-03-20 MED ORDER — SERTRALINE HCL 25 MG PO TABS
25.0000 mg | ORAL_TABLET | Freq: Every day | ORAL | 0 refills | Status: AC
  Filled 2024-03-20: qty 30, 30d supply, fill #0

## 2024-05-08 ENCOUNTER — Other Ambulatory Visit (HOSPITAL_COMMUNITY): Payer: Self-pay

## 2024-05-08 ENCOUNTER — Other Ambulatory Visit: Payer: Self-pay

## 2024-05-08 MED ORDER — TIRZEPATIDE 7.5 MG/0.5ML ~~LOC~~ SOAJ
7.5000 mg | SUBCUTANEOUS | 0 refills | Status: AC
  Filled 2024-05-08 – 2024-06-22 (×3): qty 6, 84d supply, fill #0

## 2024-05-09 ENCOUNTER — Other Ambulatory Visit (HOSPITAL_COMMUNITY): Payer: Self-pay

## 2024-05-10 ENCOUNTER — Other Ambulatory Visit (HOSPITAL_COMMUNITY): Payer: Self-pay

## 2024-05-14 ENCOUNTER — Other Ambulatory Visit (HOSPITAL_COMMUNITY): Payer: Self-pay

## 2024-05-14 MED ORDER — LISDEXAMFETAMINE DIMESYLATE 30 MG PO CAPS
30.0000 mg | ORAL_CAPSULE | Freq: Every day | ORAL | 0 refills | Status: DC
  Filled 2024-05-14: qty 30, 30d supply, fill #0

## 2024-06-22 ENCOUNTER — Other Ambulatory Visit (HOSPITAL_COMMUNITY): Payer: Self-pay

## 2024-06-22 ENCOUNTER — Other Ambulatory Visit: Payer: Self-pay

## 2024-06-25 ENCOUNTER — Other Ambulatory Visit (HOSPITAL_COMMUNITY): Payer: Self-pay

## 2024-06-25 ENCOUNTER — Other Ambulatory Visit: Payer: Self-pay

## 2024-06-25 MED ORDER — LISDEXAMFETAMINE DIMESYLATE 30 MG PO CAPS
30.0000 mg | ORAL_CAPSULE | Freq: Every day | ORAL | 0 refills | Status: AC
  Filled 2024-06-25 – 2024-06-26 (×2): qty 30, 30d supply, fill #0

## 2024-06-25 MED ORDER — LISDEXAMFETAMINE DIMESYLATE 30 MG PO CAPS: 30.0000 mg | ORAL_CAPSULE | Freq: Every morning | ORAL | 0 refills | Status: AC

## 2024-06-26 ENCOUNTER — Other Ambulatory Visit (HOSPITAL_COMMUNITY): Payer: Self-pay

## 2024-06-27 ENCOUNTER — Other Ambulatory Visit (HOSPITAL_COMMUNITY): Payer: Self-pay

## 2024-07-08 ENCOUNTER — Other Ambulatory Visit (HOSPITAL_COMMUNITY): Payer: Self-pay

## 2024-07-08 MED ORDER — BUPROPION HCL ER (XL) 300 MG PO TB24
300.0000 mg | ORAL_TABLET | Freq: Every day | ORAL | 1 refills | Status: AC
  Filled 2024-07-08: qty 90, 90d supply, fill #0

## 2024-07-08 MED ORDER — MOUNJARO 10 MG/0.5ML ~~LOC~~ SOAJ
10.0000 mg | SUBCUTANEOUS | 0 refills | Status: AC
  Filled 2024-07-08: qty 2, 28d supply, fill #0

## 2024-07-08 MED ORDER — TRUE VITAMIN D3 1.25 MG (50000 UT) PO CAPS
50000.0000 [IU] | ORAL_CAPSULE | ORAL | 1 refills | Status: AC
  Filled 2024-07-08: qty 12, 84d supply, fill #0

## 2024-07-09 ENCOUNTER — Other Ambulatory Visit: Payer: Self-pay

## 2024-07-09 ENCOUNTER — Other Ambulatory Visit (HOSPITAL_COMMUNITY): Payer: Self-pay

## 2024-07-09 MED ORDER — LISDEXAMFETAMINE DIMESYLATE 40 MG PO CAPS
40.0000 mg | ORAL_CAPSULE | Freq: Every day | ORAL | 0 refills | Status: AC
  Filled 2024-07-09: qty 30, 30d supply, fill #0
